# Patient Record
Sex: Male | Born: 1985 | Race: White | Hispanic: No | Marital: Married | Smoking: Never smoker
Health system: Southern US, Community
[De-identification: ages and names within clinical notes are randomized; demographics above are authoritative.]

## PROBLEM LIST (undated history)

## (undated) DIAGNOSIS — Z87442 Personal history of urinary calculi: Secondary | ICD-10-CM

## (undated) DIAGNOSIS — T783XXA Angioneurotic edema, initial encounter: Secondary | ICD-10-CM

## (undated) DIAGNOSIS — M5416 Radiculopathy, lumbar region: Secondary | ICD-10-CM

## (undated) DIAGNOSIS — Z8781 Personal history of (healed) traumatic fracture: Secondary | ICD-10-CM

## (undated) DIAGNOSIS — B0222 Postherpetic trigeminal neuralgia: Secondary | ICD-10-CM

## (undated) DIAGNOSIS — M1990 Unspecified osteoarthritis, unspecified site: Secondary | ICD-10-CM

## (undated) DIAGNOSIS — G43909 Migraine, unspecified, not intractable, without status migrainosus: Secondary | ICD-10-CM

## (undated) DIAGNOSIS — K219 Gastro-esophageal reflux disease without esophagitis: Secondary | ICD-10-CM

## (undated) DIAGNOSIS — N200 Calculus of kidney: Secondary | ICD-10-CM

## (undated) HISTORY — PX: OTHER SURGICAL HISTORY: SHX169

## (undated) HISTORY — DX: Postherpetic trigeminal neuralgia: B02.22

## (undated) HISTORY — DX: Unspecified osteoarthritis, unspecified site: M19.90

## (undated) HISTORY — DX: Angioneurotic edema, initial encounter: T78.3XXA

## (undated) HISTORY — DX: Personal history of (healed) traumatic fracture: Z87.81

## (undated) HISTORY — DX: Radiculopathy, lumbar region: M54.16

## (undated) HISTORY — PX: ANTERIOR CRUCIATE LIGAMENT REPAIR: SHX115

## (undated) HISTORY — DX: Gastro-esophageal reflux disease without esophagitis: K21.9

## (undated) HISTORY — DX: Migraine, unspecified, not intractable, without status migrainosus: G43.909

---
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
## 2013-10-03 ENCOUNTER — Emergency Department (HOSPITAL_BASED_OUTPATIENT_CLINIC_OR_DEPARTMENT_OTHER)
Admission: EM | Admit: 2013-10-03 | Discharge: 2013-10-03 | Disposition: A | Discharge status: Home / Self Care | Payer: 59 | Attending: Emergency Medicine | Admitting: Emergency Medicine

## 2013-10-03 ENCOUNTER — Encounter (HOSPITAL_BASED_OUTPATIENT_CLINIC_OR_DEPARTMENT_OTHER): Payer: Self-pay | Admitting: Emergency Medicine

## 2013-10-03 ENCOUNTER — Emergency Department (HOSPITAL_BASED_OUTPATIENT_CLINIC_OR_DEPARTMENT_OTHER): Payer: 59

## 2013-10-03 DIAGNOSIS — R61 Generalized hyperhidrosis: Secondary | ICD-10-CM | POA: Insufficient documentation

## 2013-10-03 DIAGNOSIS — N200 Calculus of kidney: Secondary | ICD-10-CM

## 2013-10-03 DIAGNOSIS — N1330 Unspecified hydronephrosis: Secondary | ICD-10-CM | POA: Insufficient documentation

## 2013-10-03 DIAGNOSIS — Z8639 Personal history of other endocrine, nutritional and metabolic disease: Secondary | ICD-10-CM | POA: Insufficient documentation

## 2013-10-03 DIAGNOSIS — Z862 Personal history of diseases of the blood and blood-forming organs and certain disorders involving the immune mechanism: Secondary | ICD-10-CM | POA: Insufficient documentation

## 2013-10-03 DIAGNOSIS — N201 Calculus of ureter: Secondary | ICD-10-CM | POA: Insufficient documentation

## 2013-10-03 HISTORY — DX: Gilbert syndrome: E80.4

## 2013-10-03 LAB — URINALYSIS, ROUTINE W REFLEX MICROSCOPIC
GLUCOSE, UA: NEGATIVE mg/dL
KETONES UR: 15 mg/dL — AB
Nitrite: NEGATIVE
Protein, ur: 100 mg/dL — AB
SPECIFIC GRAVITY, URINE: 1.03 (ref 1.005–1.030)
Urobilinogen, UA: 0.2 mg/dL (ref 0.0–1.0)
pH: 5.5 (ref 5.0–8.0)

## 2013-10-03 LAB — URINE MICROSCOPIC-ADD ON

## 2013-10-03 MED ORDER — MORPHINE SULFATE 4 MG/ML IJ SOLN
4.0000 mg | Freq: Once | INTRAMUSCULAR | Status: AC
  Administered 2013-10-03: 4 mg via INTRAVENOUS
  Filled 2013-10-03: qty 1

## 2013-10-03 MED ORDER — OXYCODONE-ACETAMINOPHEN 5-325 MG PO TABS: 2.0000 | ORAL_TABLET | ORAL | Status: DC | PRN

## 2013-10-03 MED ORDER — KETOROLAC TROMETHAMINE 30 MG/ML IJ SOLN
30.0000 mg | Freq: Once | INTRAMUSCULAR | Status: AC
  Administered 2013-10-03: 30 mg via INTRAVENOUS
  Filled 2013-10-03: qty 1

## 2013-10-03 MED ORDER — ONDANSETRON HCL 4 MG/2ML IJ SOLN
4.0000 mg | Freq: Once | INTRAMUSCULAR | Status: AC
  Administered 2013-10-03: 4 mg via INTRAVENOUS
  Filled 2013-10-03: qty 2

## 2013-10-03 NOTE — ED Provider Notes (Signed)
CSN: [id number]     Arrival date & time 10/03/13  3244 History   This chart was scribed for Geoffery Lyons, MD by Ladona Ridgel Day, ED scribe. This patient was seen in room MH09/MH09 and the patient's care was started at 1917.  Chief Complaint  Patient presents with  . Flank Pain   The history is provided by the patient. No language interpreter was used.   HPI Comments: Michael Werner is a 28 y.o. male who presents to the Emergency Department complaining of constant, gradually worsened right flank pain which radiates to his right groin, onset today. He reports the pain seemed to occur all at once and reports dark tea colored urine. He denies hx of kidney stones. Denies any baldder or bowel issues.   He is allergic to codeine   Past Medical History  Diagnosis Date  . Sullivan Lone syndrome    History reviewed. No pertinent past surgical history. History reviewed. No pertinent family history. History  Substance Use Topics  . Smoking status: Never Smoker   . Smokeless tobacco: Not on file  . Alcohol Use: Yes    Review of Systems  Constitutional: Negative for fever and chills.  Respiratory: Negative for cough and shortness of breath.   Cardiovascular: Negative for chest pain.  Gastrointestinal: Positive for abdominal pain. Negative for nausea and vomiting.  Genitourinary: Positive for flank pain. Negative for dysuria.  Musculoskeletal: Negative for back pain.  All other systems reviewed and are negative.   Allergies  Codeine  Home Medications  No current outpatient prescriptions on file. BP 154/111  Pulse 129  SpO2 100% Physical Exam  Nursing note and vitals reviewed. Constitutional: He is oriented to person, place, and time. He appears well-developed and well-nourished. No distress.  Pt appears uncomfortable and is writhing.   HENT:  Head: Normocephalic and atraumatic.  Eyes: Conjunctivae are normal. Right eye exhibits no discharge. Left eye exhibits no discharge.  Neck: Normal  range of motion.  Cardiovascular: Normal rate.   Pulmonary/Chest: Effort normal. No respiratory distress.  Abdominal: Soft. He exhibits no distension. There is tenderness. There is no rebound and no guarding.  Tenderness to palpation on right upper and right lower quadrants w/no rebound or guarding.   Musculoskeletal: Normal range of motion. He exhibits no edema.  Neurological: He is alert and oriented to person, place, and time.  Skin: Skin is warm. He is diaphoretic.  Psychiatric: He has a normal mood and affect. Thought content normal.   ED Course  Procedures (including critical care time) DIAGNOSTIC STUDIES: Oxygen Saturation is 100% on room air, normal by my interpretation.    COORDINATION OF CARE: At 745 PM Discussed treatment plan with patient which includes pain, nausea medicine, abdominal CT. Patient agrees.   Labs Review Labs Reviewed - No data to display Imaging Review No results found.  EKG Interpretation   None      MDM   Final diagnoses:  None    Patient presents here with complaints of severe right flank pain. His presentation and physical exam were consistent with a renal calculus. Urinalysis shows blood and CT scan reveals a 3 mm stone at the UVJ with hydronephrosis. He is feeling better with Toradol and morphine and appears appropriate for discharge. He'll be given pain medication and advised to followup with urology if he has not passed the stone in the next 3 days. He understands to return if he develops severe pain unrelieved with his medications, high fever, or other new or concerning  symptoms.   I personally performed the services described in this documentation, which was scribed in my presence. The recorded information has been reviewed and is accurate.      Geoffery Lyonsouglas Cordel Drewes, MD 10/03/13 2131

## 2013-10-03 NOTE — Discharge Instructions (Signed)
Percocet as prescribed as needed for pain.  Return to the emergency department if you develop severe pain not relieved with your medicine, high fever, or other new or concerning symptoms.  If you're still having pain on Monday, call to arrange a followup appointment with urology. The contact information has been provided on this discharge summary.   Kidney Stones Kidney stones (urolithiasis) are deposits that form inside your kidneys. The intense pain is caused by the stone moving through the urinary tract. When the stone moves, the ureter goes into spasm around the stone. The stone is usually passed in the urine.  CAUSES   A disorder that makes certain neck glands produce too much parathyroid hormone (primary hyperparathyroidism).  A buildup of uric acid crystals, similar to gout in your joints.  Narrowing (stricture) of the ureter.  A kidney obstruction present at birth (congenital obstruction).  Previous surgery on the kidney or ureters.  Numerous kidney infections. SYMPTOMS   Feeling sick to your stomach (nauseous).  Throwing up (vomiting).  Blood in the urine (hematuria).  Pain that usually spreads (radiates) to the groin.  Frequency or urgency of urination. DIAGNOSIS   Taking a history and physical exam.  Blood or urine tests.  CT scan.  Occasionally, an examination of the inside of the urinary bladder (cystoscopy) is performed. TREATMENT   Observation.  Increasing your fluid intake.  Extracorporeal shock wave lithotripsy This is a noninvasive procedure that uses shock waves to break up kidney stones.  Surgery may be needed if you have severe pain or persistent obstruction. There are various surgical procedures. Most of the procedures are performed with the use of small instruments. Only small incisions are needed to accommodate these instruments, so recovery time is minimized. The size, location, and chemical composition are all important variables that will  determine the proper choice of action for you. Talk to your health care provider to better understand your situation so that you will minimize the risk of injury to yourself and your kidney.  HOME CARE INSTRUCTIONS   Drink enough water and fluids to keep your urine clear or pale yellow. This will help you to pass the stone or stone fragments.  Strain all urine through the provided strainer. Keep all particulate matter and stones for your health care provider to see. The stone causing the pain may be as small as a grain of salt. It is very important to use the strainer each and every time you pass your urine. The collection of your stone will allow your health care provider to analyze it and verify that a stone has actually passed. The stone analysis will often identify what you can do to reduce the incidence of recurrences.  Only take over-the-counter or prescription medicines for pain, discomfort, or fever as directed by your health care provider.  Make a follow-up appointment with your health care provider as directed.  Get follow-up X-rays if required. The absence of pain does not always mean that the stone has passed. It may have only stopped moving. If the urine remains completely obstructed, it can cause loss of kidney function or even complete destruction of the kidney. It is your responsibility to make sure X-rays and follow-ups are completed. Ultrasounds of the kidney can show blockages and the status of the kidney. Ultrasounds are not associated with any radiation and can be performed easily in a matter of minutes. SEEK MEDICAL CARE IF:  You experience pain that is progressive and unresponsive to any pain medicine you  have been prescribed. SEEK IMMEDIATE MEDICAL CARE IF:   Pain cannot be controlled with the prescribed medicine.  You have a fever or shaking chills.  The severity or intensity of pain increases over 18 hours and is not relieved by pain medicine.  You develop a new onset  of abdominal pain.  You feel faint or pass out.  You are unable to urinate. MAKE SURE YOU:   Understand these instructions.  Will watch your condition.  Will get help right away if you are not doing well or get worse. Document Released: 08/01/2005 Document Revised: 04/03/2013 Document Reviewed: 01/02/2013 [facility] Patient Information 2014 [place], [place].

## 2013-10-03 NOTE — ED Notes (Signed)
Pt states right flank pain with no history of kidney stones. Pt states that his urine is dark as well. Pt states pain radiates into his groin and has gotten worse through the day. Pt denies problems with urination.

## 2013-10-05 ENCOUNTER — Emergency Department (HOSPITAL_BASED_OUTPATIENT_CLINIC_OR_DEPARTMENT_OTHER)
Admission: EM | Admit: 2013-10-05 | Discharge: 2013-10-05 | Disposition: A | Discharge status: Home / Self Care | Payer: 59 | Attending: Emergency Medicine | Admitting: Emergency Medicine

## 2013-10-05 ENCOUNTER — Encounter (HOSPITAL_BASED_OUTPATIENT_CLINIC_OR_DEPARTMENT_OTHER): Payer: Self-pay | Admitting: Emergency Medicine

## 2013-10-05 DIAGNOSIS — N200 Calculus of kidney: Secondary | ICD-10-CM | POA: Insufficient documentation

## 2013-10-05 DIAGNOSIS — Z862 Personal history of diseases of the blood and blood-forming organs and certain disorders involving the immune mechanism: Secondary | ICD-10-CM | POA: Insufficient documentation

## 2013-10-05 DIAGNOSIS — Z8639 Personal history of other endocrine, nutritional and metabolic disease: Secondary | ICD-10-CM | POA: Insufficient documentation

## 2013-10-05 HISTORY — DX: Calculus of kidney: N20.0

## 2013-10-05 MED ORDER — ONDANSETRON 4 MG PO TBDP: ORAL_TABLET | ORAL | Status: DC

## 2013-10-05 MED ORDER — OXYCODONE-ACETAMINOPHEN 5-325 MG PO TABS
2.0000 | ORAL_TABLET | Freq: Four times a day (QID) | ORAL | Status: DC | PRN
Start: 2013-10-05 — End: 2015-12-04

## 2013-10-05 MED ORDER — TAMSULOSIN HCL 0.4 MG PO CAPS: 0.4000 mg | ORAL_CAPSULE | Freq: Every day | ORAL | Status: DC

## 2013-10-05 NOTE — ED Provider Notes (Signed)
CSN: [id number]     Arrival date & time 10/05/13  0945 History   First MD Initiated Contact with Patient 10/05/13 1135     Chief Complaint  Patient presents with  . Medication Refill     (Consider location/radiation/quality/duration/timing/severity/associated sxs/prior Treatment) Patient is a 28 y.o. male presenting with flank pain. The history is provided by the patient.  Flank Pain This is a new problem. The current episode started 2 days ago. Episode frequency: intermittent. The problem has been gradually improving. Pertinent negatives include no chest pain, no abdominal pain, no headaches and no shortness of breath. Nothing aggravates the symptoms. Nothing relieves the symptoms. Treatments tried: percocet. The treatment provided significant relief.    Past Medical History  Diagnosis Date  . Gilbert syndrome   . Kidney stones    History reviewed. No pertinent past surgical history. No family history on file. History  Substance Use Topics  . Smoking status: Never Smoker   . Smokeless tobacco: Not on file  . Alcohol Use: Yes    Review of Systems  Constitutional: Negative for fever.  HENT: Negative for drooling and rhinorrhea.   Eyes: Negative for pain.  Respiratory: Negative for cough and shortness of breath.   Cardiovascular: Negative for chest pain and leg swelling.  Gastrointestinal: Negative for nausea, vomiting, abdominal pain and diarrhea.  Genitourinary: Positive for flank pain. Negative for dysuria and hematuria.  Musculoskeletal: Negative for gait problem and neck pain.  Skin: Negative for color change.  Neurological: Negative for numbness and headaches.  Hematological: Negative for adenopathy.  Psychiatric/Behavioral: Negative for behavioral problems.  All other systems reviewed and are negative.      Allergies  Codeine  Home Medications   Current Outpatient Rx  Name  Route  Sig  Dispense  Refill  . oxyCODONE-acetaminophen (PERCOCET) 5-325 MG per  tablet   Oral   Take 2 tablets by mouth every 4 (four) hours as needed.   20 tablet   0    BP 152/97  Pulse 98  Temp(Src) 98.8 F (37.1 C) (Oral)  Resp 18  SpO2 96% Physical Exam  Nursing note and vitals reviewed. Constitutional: He is oriented to person, place, and time. He appears well-developed and well-nourished.  HENT:  Head: Normocephalic and atraumatic.  Right Ear: External ear normal.  Left Ear: External ear normal.  Nose: Nose normal.  Mouth/Throat: Oropharynx is clear and moist. No oropharyngeal exudate.  Eyes: Conjunctivae and EOM are normal. Pupils are equal, round, and reactive to light.  Neck: Normal range of motion. Neck supple.  Cardiovascular: Normal rate, regular rhythm, normal heart sounds and intact distal pulses.  Exam reveals no gallop and no friction rub.   No murmur heard. Pulmonary/Chest: Effort normal and breath sounds normal. No respiratory distress. He has no wheezes.  Abdominal: Soft. Bowel sounds are normal. He exhibits no distension. There is no tenderness. There is no rebound and no guarding.  Musculoskeletal: Normal range of motion. He exhibits no edema and no tenderness.  No CVA ttp noted.   Neurological: He is alert and oriented to person, place, and time.  Skin: Skin is warm and dry.  Psychiatric: He has a normal mood and affect. His behavior is normal.    ED Course  Procedures (including critical care time) Labs Review Labs Reviewed - No data to display Imaging Review Ct Abdomen Pelvis Wo Contrast  10/03/2013   CLINICAL DATA:  Right lower quadrant pain, hematuria. Possible kidney stone.  EXAM: CT ABDOMEN AND PELVIS  WITHOUT CONTRAST  TECHNIQUE: Multidetector CT imaging of the abdomen and pelvis was performed following the standard protocol without intravenous contrast.  COMPARISON:  None.  FINDINGS: Lung bases are clear. No effusions. Heart is normal size.  Liver, gallbladder, spleen, pancreas, adrenals are unremarkable. Punctate lower pole  right renal stone noted which is nonobstructing. There is mild fullness/hydronephrosis on the right due to a 3 mm distal right ureteral stone.  Appendix is visualized and is normal. Stomach, large and small bowel grossly unremarkable. Urinary bladder is decompressed. No acute bony abnormality.  IMPRESSION: Obstructing distal right ureteral stone measuring 3 mm. Mild right hydronephrosis. Small right lower pole nonobstructing renal stone.  Normal appendix.   Electronically Signed   By: Charlett NoseKevin  Dover M.D.   On: 10/03/2013 21:26    EKG Interpretation   None       MDM   Final diagnoses:  Nephrolithiasis    12:10 PM 28 y.o. male seen here several days ago for right flank pain and found to have a kidney stone. He notes that he ran out of his Percocet at home but has continued pain. He was taking the Percocet 2 tablets every 4 hours as prescribed. He is in no pain on exam now. He denies any fevers, vomiting, or diarrhea. He has no new symptoms. He is afebrile and vital signs are unremarkable here. Will provide new prescription and recommend he take 1 tablet of Percocet every 6 hours.  12:11 PM: Will provide new Rx's.  I have discussed the diagnosis/risks/treatment options with the patient and believe the pt to be eligible for discharge home to follow-up with urology as needed. We also discussed returning to the ED immediately if new or worsening sx occur. We discussed the sx which are most concerning (e.g., fever, inability to take po, worsening pain) that necessitate immediate return. Medications administered to the patient during their visit and any new prescriptions provided to the patient are listed below.  Medications given during this visit Medications - No data to display  Discharge Medication List as of 10/05/2013 12:13 PM    START taking these medications   Details  ondansetron (ZOFRAN ODT) 4 MG disintegrating tablet 4mg  ODT q4 hours prn nausea/vomit, Print    !! oxyCODONE-acetaminophen  (PERCOCET) 5-325 MG per tablet Take 2 tablets by mouth every 6 (six) hours as needed for moderate pain., Starting 10/05/2013, Until Discontinued, Print    tamsulosin (FLOMAX) 0.4 MG CAPS capsule Take 1 capsule (0.4 mg total) by mouth daily., Starting 10/05/2013, Until Discontinued, Print     !! - Potential duplicate medications found. Please discuss with provider.         Junius ArgyleForrest S Harrel Ferrone, MD 10/05/13 2038

## 2013-10-05 NOTE — Discharge Instructions (Signed)
Kidney Stones  Kidney stones (urolithiasis) are deposits that form inside your kidneys. The intense pain is caused by the stone moving through the urinary tract. When the stone moves, the ureter goes into spasm around the stone. The stone is usually passed in the urine.   CAUSES   · A disorder that makes certain neck glands produce too much parathyroid hormone (primary hyperparathyroidism).  · A buildup of uric acid crystals, similar to gout in your joints.  · Narrowing (stricture) of the ureter.  · A kidney obstruction present at birth (congenital obstruction).  · Previous surgery on the kidney or ureters.  · Numerous kidney infections.  SYMPTOMS   · Feeling sick to your stomach (nauseous).  · Throwing up (vomiting).  · Blood in the urine (hematuria).  · Pain that usually spreads (radiates) to the groin.  · Frequency or urgency of urination.  DIAGNOSIS   · Taking a history and physical exam.  · Blood or urine tests.  · CT scan.  · Occasionally, an examination of the inside of the urinary bladder (cystoscopy) is performed.  TREATMENT   · Observation.  · Increasing your fluid intake.  · Extracorporeal shock wave lithotripsy This is a noninvasive procedure that uses shock waves to break up kidney stones.  · Surgery may be needed if you have severe pain or persistent obstruction. There are various surgical procedures. Most of the procedures are performed with the use of small instruments. Only small incisions are needed to accommodate these instruments, so recovery time is minimized.  The size, location, and chemical composition are all important variables that will determine the proper choice of action for you. Talk to your health care provider to better understand your situation so that you will minimize the risk of injury to yourself and your kidney.   HOME CARE INSTRUCTIONS   · Drink enough water and fluids to keep your urine clear or pale yellow. This will help you to pass the stone or stone fragments.  · Strain  all urine through the provided strainer. Keep all particulate matter and stones for your health care provider to see. The stone causing the pain may be as small as a grain of salt. It is very important to use the strainer each and every time you pass your urine. The collection of your stone will allow your health care provider to analyze it and verify that a stone has actually passed. The stone analysis will often identify what you can do to reduce the incidence of recurrences.  · Only take over-the-counter or prescription medicines for pain, discomfort, or fever as directed by your health care provider.  · Make a follow-up appointment with your health care provider as directed.  · Get follow-up X-rays if required. The absence of pain does not always mean that the stone has passed. It may have only stopped moving. If the urine remains completely obstructed, it can cause loss of kidney function or even complete destruction of the kidney. It is your responsibility to make sure X-rays and follow-ups are completed. Ultrasounds of the kidney can show blockages and the status of the kidney. Ultrasounds are not associated with any radiation and can be performed easily in a matter of minutes.  SEEK MEDICAL CARE IF:  · You experience pain that is progressive and unresponsive to any pain medicine you have been prescribed.  SEEK IMMEDIATE MEDICAL CARE IF:   · Pain cannot be controlled with the prescribed medicine.  · You have a fever   or shaking chills.  · The severity or intensity of pain increases over 18 hours and is not relieved by pain medicine.  · You develop a new onset of abdominal pain.  · You feel faint or pass out.  · You are unable to urinate.  MAKE SURE YOU:   · Understand these instructions.  · Will watch your condition.  · Will get help right away if you are not doing well or get worse.  Document Released: 08/01/2005 Document Revised: 04/03/2013 Document Reviewed: 01/02/2013  Exit[employer]® Patient Information ©2014  [employer].

## 2013-10-05 NOTE — ED Notes (Signed)
Patient here needing more pain meds for ongoing kidney stone, seen here on THursday for same, no pain on assessment

## 2013-10-10 ENCOUNTER — Emergency Department (HOSPITAL_BASED_OUTPATIENT_CLINIC_OR_DEPARTMENT_OTHER): Payer: 59

## 2013-10-10 ENCOUNTER — Encounter (HOSPITAL_BASED_OUTPATIENT_CLINIC_OR_DEPARTMENT_OTHER): Payer: Self-pay | Admitting: Emergency Medicine

## 2013-10-10 ENCOUNTER — Emergency Department (HOSPITAL_BASED_OUTPATIENT_CLINIC_OR_DEPARTMENT_OTHER)
Admission: EM | Admit: 2013-10-10 | Discharge: 2013-10-10 | Disposition: A | Discharge status: Home / Self Care | Payer: 59 | Attending: Emergency Medicine | Admitting: Emergency Medicine

## 2013-10-10 DIAGNOSIS — Z862 Personal history of diseases of the blood and blood-forming organs and certain disorders involving the immune mechanism: Secondary | ICD-10-CM | POA: Insufficient documentation

## 2013-10-10 DIAGNOSIS — Z79899 Other long term (current) drug therapy: Secondary | ICD-10-CM | POA: Insufficient documentation

## 2013-10-10 DIAGNOSIS — Z87442 Personal history of urinary calculi: Secondary | ICD-10-CM | POA: Insufficient documentation

## 2013-10-10 DIAGNOSIS — Z8639 Personal history of other endocrine, nutritional and metabolic disease: Secondary | ICD-10-CM | POA: Insufficient documentation

## 2013-10-10 DIAGNOSIS — N23 Unspecified renal colic: Secondary | ICD-10-CM | POA: Insufficient documentation

## 2013-10-10 LAB — URINALYSIS, ROUTINE W REFLEX MICROSCOPIC
GLUCOSE, UA: NEGATIVE mg/dL
KETONES UR: 40 mg/dL — AB
LEUKOCYTES UA: NEGATIVE
Nitrite: NEGATIVE
PROTEIN: 100 mg/dL — AB
Specific Gravity, Urine: 1.046 — ABNORMAL HIGH (ref 1.005–1.030)
UROBILINOGEN UA: 1 mg/dL (ref 0.0–1.0)
pH: 6 (ref 5.0–8.0)

## 2013-10-10 LAB — URINE MICROSCOPIC-ADD ON

## 2013-10-10 MED ORDER — SODIUM CHLORIDE 0.9 % IV SOLN
1000.0000 mL | Freq: Once | INTRAVENOUS | Status: AC
  Administered 2013-10-10: 1000 mL via INTRAVENOUS

## 2013-10-10 MED ORDER — HYDROMORPHONE HCL PF 1 MG/ML IJ SOLN
1.0000 mg | Freq: Once | INTRAMUSCULAR | Status: AC
  Administered 2013-10-10: 1 mg via INTRAVENOUS
  Filled 2013-10-10: qty 1

## 2013-10-10 MED ORDER — KETOROLAC TROMETHAMINE 30 MG/ML IJ SOLN
30.0000 mg | Freq: Once | INTRAMUSCULAR | Status: AC
  Administered 2013-10-10: 30 mg via INTRAVENOUS
  Filled 2013-10-10: qty 1

## 2013-10-10 MED ORDER — ONDANSETRON HCL 4 MG/2ML IJ SOLN
4.0000 mg | Freq: Once | INTRAMUSCULAR | Status: AC
  Administered 2013-10-10: 4 mg via INTRAVENOUS
  Filled 2013-10-10: qty 2

## 2013-10-10 MED ORDER — HYDROMORPHONE HCL 2 MG PO TABS
2.0000 mg | ORAL_TABLET | ORAL | Status: DC | PRN
Start: 2013-10-10 — End: 2018-11-05

## 2013-10-10 MED ORDER — SODIUM CHLORIDE 0.9 % IV SOLN
1000.0000 mL | INTRAVENOUS | Status: DC
  Administered 2013-10-10: 1000 mL via INTRAVENOUS

## 2013-10-10 MED ORDER — HYDROMORPHONE HCL PF 1 MG/ML IJ SOLN
0.5000 mg | Freq: Once | INTRAMUSCULAR | Status: AC
  Administered 2013-10-10: 0.5 mg via INTRAVENOUS
  Filled 2013-10-10: qty 1

## 2013-10-10 NOTE — ED Notes (Signed)
Rt flank pain  Hx kidney stone x 1 week

## 2013-10-10 NOTE — ED Notes (Signed)
C/o rt flank pain off and on x 1 week  Worse last 7 hours,  Has been seen by md for same  Dx w kidney stone

## 2013-10-10 NOTE — ED Provider Notes (Signed)
CSN: [id number]     Arrival date & time 10/10/13  0451 History   First MD Initiated Contact with Patient 10/10/13 0500     Chief Complaint: Flank pain  (Consider location/radiation/quality/duration/timing/severity/associated sxs/prior Treatment) The history is provided by the patient.   28 year old male comes in because of severe right flank pain which started at about 10 PM. Pain radiates somewhat to the right lower abdomen. He rates pain at 10/10. Nothing makes it better nothing makes it worse. There is associated nausea for which he took ondansetron with some relief. He took 2 doses of oxycodone acetaminophen with no relief of pain. He drank 4 glasses of water and has not had urine output despite that . He denies dysuria, fever, chills. He had been seen here one week ago for kidney stone and began 5 days ago. He had been referred to urology and thought he was supposed to have surgery on a right sided kidney stone but when he arrived for surgery if he found that it had not been scheduled.  Past Medical History  Diagnosis Date  . Gilbert syndrome   . Kidney stones    No past surgical history on file. No family history on file. History  Substance Use Topics  . Smoking status: Never Smoker   . Smokeless tobacco: Not on file  . Alcohol Use: Yes    Review of Systems  All other systems reviewed and are negative.      Allergies  Codeine  Home Medications   Current Outpatient Rx  Name  Route  Sig  Dispense  Refill  . ondansetron (ZOFRAN ODT) 4 MG disintegrating tablet      4mg  ODT q4 hours prn nausea/vomit   10 tablet   0   . oxyCODONE-acetaminophen (PERCOCET) 5-325 MG per tablet   Oral   Take 2 tablets by mouth every 4 (four) hours as needed.   20 tablet   0   . oxyCODONE-acetaminophen (PERCOCET) 5-325 MG per tablet   Oral   Take 2 tablets by mouth every 6 (six) hours as needed for moderate pain.   20 tablet   0   . tamsulosin (FLOMAX) 0.4 MG CAPS capsule   Oral  Take 1 capsule (0.4 mg total) by mouth daily.   20 capsule   0    BP 92/59  Pulse 94  Temp(Src) 98.6 F (37 C) (Oral)  Resp 22  Ht 6' (1.829 m)  Wt 210 lb (95.255 kg)  BMI 28.47 kg/m2  SpO2 100% Physical Exam  Nursing note and vitals reviewed.  28 year old male,  in obvious pain and very restless, but in no respiratory distress. Vital signs are  significant for tachypnea with respiratory rate of 22, and somewhat low blood pressure 92/59. Oxygen saturation is 100%, which is normal. Head is normocephalic and atraumatic. PERRLA, EOMI. Oropharynx is clear. Neck is nontender and supple without adenopathy or JVD. Back is nontender in the midline. There is moderate right  CVA tenderness. Lungs are clear without rales, wheezes, or rhonchi. Chest is nontender. Heart has regular rate and rhythm without murmur. Abdomen is soft, flat, nontender without masses or hepatosplenomegaly and peristalsis is hypoactive. Extremities have no cyanosis or edema, full range of motion is present. Skin is warm and mildly diaphoretic  without rash. Neurologic: Mental status is normal, cranial nerves are intact, there are no motor or sensory deficits.  ED Course  Procedures (including critical care time) Labs Review Results for orders placed during the hospital  encounter of 10/10/13  URINALYSIS, ROUTINE W REFLEX MICROSCOPIC      Result Value Ref Range   Color, Urine AMBER (*) YELLOW   APPearance CLOUDY (*) CLEAR   Specific Gravity, Urine 1.046 (*) 1.005 - 1.030   pH 6.0  5.0 - 8.0   Glucose, UA NEGATIVE  NEGATIVE mg/dL   Hgb urine dipstick LARGE (*) NEGATIVE   Bilirubin Urine MODERATE (*) NEGATIVE   Ketones, ur 40 (*) NEGATIVE mg/dL   Protein, ur 161 (*) NEGATIVE mg/dL   Urobilinogen, UA 1.0  0.0 - 1.0 mg/dL   Nitrite NEGATIVE  NEGATIVE   Leukocytes, UA NEGATIVE  NEGATIVE  URINE MICROSCOPIC-ADD ON      Result Value Ref Range   Squamous Epithelial / LPF RARE  RARE   WBC, UA 0-2  <3 WBC/hpf   RBC  / HPF 21-50  <3 RBC/hpf   Bacteria, UA RARE  RARE   Sperm, UA PRESENT     Urine-Other MUCOUS PRESENT     Imaging Review Dg Abd 1 View  10/10/2013   CLINICAL DATA:  Right flank pain and hematuria.  EXAM: ABDOMEN - 1 VIEW  COMPARISON:  CT of the abdomen and pelvis performed 10/03/2013  FINDINGS: The visualized bowel gas pattern is unremarkable. Scattered air and stool filled loops of colon are seen; no abnormal dilatation of small bowel loops is seen to suggest small bowel obstruction. No free intra-abdominal air is identified, though evaluation for free air is limited on a single supine view.  The previously noted right ureteral stone is not well characterized on the current study; a small phlebolith is noted at the right hemipelvis.  The visualized osseous structures are within normal limits; the sacroiliac joints are unremarkable in appearance. The visualized lung bases are essentially clear.  IMPRESSION: Previously noted right ureteral stone is not well characterized on the current study. Unremarkable bowel gas pattern; no free intra-abdominal air seen.   Electronically Signed   By: Roanna Raider M.D.   On: 10/10/2013 05:34   Images viewed by me.  MDM   Final diagnoses:  Ureteral colic    Ureteral colic. Old records are reviewed and I reviewed his CT scan from his ED visit in one week ago. He had a 3 mm obstructing right distal ureteral calculus twitches visible on the topogram, and so a KUB will be obtained to assess whether it has moved. He is given IV fluids, IV hydromorphone, IV ondansetron, and IV ketorolac.  He got excellent relief from the above noted treatment. Although radiologist was not able to identify the ureteral stone, I believe I can identify it and it has not moved. He is observed in the ED.  Following IV fluid bolus, he is able to urinate. He is discharged with a prescription for hydromorphone. He is scheduled for cystoscopy and possible stone extraction in five days - he is  to keep that appointment.  Dione Booze, MD 10/10/13 [phone]

## 2013-10-10 NOTE — Discharge Instructions (Signed)
Continue taking Flomax. Return to the ED if pain is not being adequately controlled, or if you start running a fever.  Kidney Stones Kidney stones (urolithiasis) are deposits that form inside your kidneys. The intense pain is caused by the stone moving through the urinary tract. When the stone moves, the ureter goes into spasm around the stone. The stone is usually passed in the urine.  CAUSES   A disorder that makes certain neck glands produce too much parathyroid hormone (primary hyperparathyroidism).  A buildup of uric acid crystals, similar to gout in your joints.  Narrowing (stricture) of the ureter.  A kidney obstruction present at birth (congenital obstruction).  Previous surgery on the kidney or ureters.  Numerous kidney infections. SYMPTOMS   Feeling sick to your stomach (nauseous).  Throwing up (vomiting).  Blood in the urine (hematuria).  Pain that usually spreads (radiates) to the groin.  Frequency or urgency of urination. DIAGNOSIS   Taking a history and physical exam.  Blood or urine tests.  CT scan.  Occasionally, an examination of the inside of the urinary bladder (cystoscopy) is performed. TREATMENT   Observation.  Increasing your fluid intake.  Extracorporeal shock wave lithotripsy This is a noninvasive procedure that uses shock waves to break up kidney stones.  Surgery may be needed if you have severe pain or persistent obstruction. There are various surgical procedures. Most of the procedures are performed with the use of small instruments. Only small incisions are needed to accommodate these instruments, so recovery time is minimized. The size, location, and chemical composition are all important variables that will determine the proper choice of action for you. Talk to your health care provider to better understand your situation so that you will minimize the risk of injury to yourself and your kidney.  HOME CARE INSTRUCTIONS   Drink enough water  and fluids to keep your urine clear or pale yellow. This will help you to pass the stone or stone fragments.  Strain all urine through the provided strainer. Keep all particulate matter and stones for your health care provider to see. The stone causing the pain may be as small as a grain of salt. It is very important to use the strainer each and every time you pass your urine. The collection of your stone will allow your health care provider to analyze it and verify that a stone has actually passed. The stone analysis will often identify what you can do to reduce the incidence of recurrences.  Only take over-the-counter or prescription medicines for pain, discomfort, or fever as directed by your health care provider.  Make a follow-up appointment with your health care provider as directed.  Get follow-up X-rays if required. The absence of pain does not always mean that the stone has passed. It may have only stopped moving. If the urine remains completely obstructed, it can cause loss of kidney function or even complete destruction of the kidney. It is your responsibility to make sure X-rays and follow-ups are completed. Ultrasounds of the kidney can show blockages and the status of the kidney. Ultrasounds are not associated with any radiation and can be performed easily in a matter of minutes. SEEK MEDICAL CARE IF:  You experience pain that is progressive and unresponsive to any pain medicine you have been prescribed. SEEK IMMEDIATE MEDICAL CARE IF:   Pain cannot be controlled with the prescribed medicine.  You have a fever or shaking chills.  The severity or intensity of pain increases over 18 hours  and is not relieved by pain medicine.  You develop a new onset of abdominal pain.  You feel faint or pass out.  You are unable to urinate. MAKE SURE YOU:   Understand these instructions.  Will watch your condition.  Will get help right away if you are not doing well or get worse. Document  Released: 08/01/2005 Document Revised: 04/03/2013 Document Reviewed: 01/02/2013 [facility] Patient Information 2014 [place], [place].  Hydromorphone tablets What is this medicine? HYDROMORPHONE (hye droe MOR fone) is a pain reliever. It is used to treat moderate to severe pain. This medicine may be used for other purposes; ask your health care provider or pharmacist if you have questions. COMMON BRAND NAME(S): Dilaudid What should I tell my health care provider before I take this medicine? They need to know if you have any of these conditions: -brain tumor -drug abuse or addiction -head injury -heart disease -frequently drink alcohol containing drinks -kidney disease or problems going to the bathroom -liver disease -lung disease, asthma, or breathing problems -mental problems -an allergic or unusual reaction to lactose, hydromorphone, other opioid analgesics, other medicines, sulfites, foods, dyes, or preservatives -pregnant or trying to get pregnant -breast-feeding How should I use this medicine? Take this medicine by mouth with a glass of water. If the medicine upsets your stomach, take it with food or milk. Follow the directions on the prescription label. Do not take more medicine than you are told to take. Talk to your pediatrician regarding the use of this medicine in children. Special care may be needed. Overdosage: If you think you have taken too much of this medicine contact a poison control center or emergency room at once. NOTE: This medicine is only for you. Do not share this medicine with others. What if I miss a dose? If you miss a dose, take it as soon as you can. If it is almost time for your next dose, take only that dose. Do not take double or extra doses. What may interact with this medicine? -alcohol -antihistamines for allergy, cough and cold -medicines for anesthesia -medicines for depression, anxiety, or psychotic disturbances -medicines for sleep -muscle  relaxants -naltrexone -narcotic medicines (opiates) for pain -phenothiazines like chlorpromazine, mesoridazine, prochlorperazine, thioridazine -tramadol This list may not describe all possible interactions. Give your health care provider a list of all the medicines, herbs, non-prescription drugs, or dietary supplements you use. Also tell them if you smoke, drink alcohol, or use illegal drugs. Some items may interact with your medicine. What should I watch for while using this medicine? Tell your doctor or health care professional if your pain does not go away, if it gets worse, or if you have new or a different type of pain. You may develop tolerance to the medicine. Tolerance means that you will need a higher dose of the medicine for pain relief. Tolerance is normal and is expected if you take this medicine for a long time. Do not suddenly stop taking your medicine because you may develop a severe reaction. Your body becomes used to the medicine. This does NOT mean you are addicted. Addiction is a behavior related to getting and using a drug for a non-medical reason. If you have pain, you have a medical reason to take pain medicine. Your doctor will tell you how much medicine to take. If your doctor wants you to stop the medicine, the dose will be slowly lowered over time to avoid any side effects. You may get drowsy or dizzy. Do not drive,  use machinery, or do anything that needs mental alertness until you know how this medicine affects you. Do not stand or sit up quickly, especially if you are an older patient. This reduces the risk of dizzy or fainting spells. Alcohol may interfere with the effect of this medicine. Avoid alcoholic drinks. There are different types of narcotic medicines (opiates) for pain. If you take more than one type at the same time, you may have more side effects. Give your health care provider a list of all medicines you use. Your doctor will tell you how much medicine to take. Do  not take more medicine than directed. Call emergency for help if you have problems breathing. This medicine will cause constipation. Try to have a bowel movement at least every 2 to 3 days. If you do not have a bowel movement for 3 days, call your doctor or health care professional. Your mouth may get dry. Chewing sugarless gum or sucking hard candy, and drinking plenty of water may help. Contact your doctor if the problem does not go away or is severe. What side effects may I notice from receiving this medicine? Side effects that you should report to your doctor or health care professional as soon as possible: -allergic reactions like skin rash, itching or hives, swelling of the face, lips, or tongue -breathing problems -changes in vision -confusion -feeling faint or lightheaded, falls -seizures -slow or fast heartbeat -trouble passing urine or change in the amount of urine -trouble with balance, talking, walking -unusually weak or tired  Side effects that usually do not require medical attention (report to your doctor or health care professional if they continue or are bothersome): -difficulty sleeping -drowsiness -dry mouth -flushing -headache -itching -loss of appetite -nausea, vomiting This list may not describe all possible side effects. Call your doctor for medical advice about side effects. You may report side effects to FDA at 1-800-FDA-1088. Where should I keep my medicine? Keep out of the reach of children. This medicine can be abused. Keep your medicine in a safe place to protect it from theft. Do not share this medicine with anyone. Selling or giving away this medicine is dangerous and against the law. Store at room temperature between 15 and 30 degrees C (59 and 86 degrees F). Keep container tightly closed. Protect from light. This medicine may cause accidental overdose and death if it is taken by other adults, children, or pets. Flush any unused medicine down the toilet to  reduce the chance of harm. Do not use the medicine after the expiration date. NOTE: This sheet is a summary. It may not cover all possible information. If you have questions about this medicine, talk to your doctor, pharmacist, or health care provider.  2014, [employer]. (2013-03-05 09:50:15)

## 2015-12-04 ENCOUNTER — Emergency Department
Admission: EM | Admit: 2015-12-04 | Discharge: 2015-12-04 | Disposition: A | Discharge status: Home / Self Care | Payer: 59 | Source: Home / Self Care

## 2015-12-04 DIAGNOSIS — N23 Unspecified renal colic: Secondary | ICD-10-CM

## 2015-12-04 LAB — POCT URINALYSIS DIPSTICK
Bilirubin, UA: NEGATIVE
Glucose, UA: NEGATIVE
KETONES UA: NEGATIVE
Leukocytes, UA: NEGATIVE
Nitrite, UA: NEGATIVE
PH UA: 6 (ref 5–8)
Protein, UA: 30
Spec Grav, UA: >=1.03 (ref 1.005–1.03)
Urobilinogen, UA: 1 (ref 0–1)

## 2015-12-04 MED ORDER — TAMSULOSIN HCL 0.4 MG PO CAPS: 0.4000 mg | ORAL_CAPSULE | Freq: Every day | ORAL | Status: DC

## 2015-12-04 MED ORDER — KETOROLAC TROMETHAMINE 60 MG/2ML IM SOLN
60.0000 mg | Freq: Once | INTRAMUSCULAR | Status: AC
  Administered 2015-12-04: 60 mg via INTRAMUSCULAR

## 2015-12-04 MED ORDER — OXYCODONE-ACETAMINOPHEN 5-325 MG PO TABS: 1.0000 | ORAL_TABLET | Freq: Four times a day (QID) | ORAL | Status: DC | PRN

## 2015-12-04 NOTE — ED Notes (Signed)
Pt has a hx of kidney stones 3 years ago.  Today had blood in urine, lower back pain.  Pt stated that right now pain is a 6-7, on way here 8-9.

## 2015-12-04 NOTE — Discharge Instructions (Signed)
Increase fluid intake.  Strain urine.  May take Zofran as needed for nausea. If symptoms become significantly worse during the night or over the weekend, proceed to the local emergency room.    Kidney Stones Kidney stones (urolithiasis) are deposits that form inside your kidneys. The intense pain is caused by the stone moving through the urinary tract. When the stone moves, the ureter goes into spasm around the stone. The stone is usually passed in the urine.  CAUSES   A disorder that makes certain neck glands produce too much parathyroid hormone (primary hyperparathyroidism).  A buildup of uric acid crystals, similar to gout in your joints.  Narrowing (stricture) of the ureter.  A kidney obstruction present at birth (congenital obstruction).  Previous surgery on the kidney or ureters.  Numerous kidney infections. SYMPTOMS   Feeling sick to your stomach (nauseous).  Throwing up (vomiting).  Blood in the urine (hematuria).  Pain that usually spreads (radiates) to the groin.  Frequency or urgency of urination. DIAGNOSIS   Taking a history and physical exam.  Blood or urine tests.  CT scan.  Occasionally, an examination of the inside of the urinary bladder (cystoscopy) is performed. TREATMENT   Observation.  Increasing your fluid intake.  Extracorporeal shock wave lithotripsy--This is a noninvasive procedure that uses shock waves to break up kidney stones.  Surgery may be needed if you have severe pain or persistent obstruction. There are various surgical procedures. Most of the procedures are performed with the use of small instruments. Only small incisions are needed to accommodate these instruments, so recovery time is minimized. The size, location, and chemical composition are all important variables that will determine the proper choice of action for you. Talk to your health care provider to better understand your situation so that you will minimize the risk of  injury to yourself and your kidney.  HOME CARE INSTRUCTIONS   Drink enough water and fluids to keep your urine clear or pale yellow. This will help you to pass the stone or stone fragments.  Strain all urine through the provided strainer. Keep all particulate matter and stones for your health care provider to see. The stone causing the pain may be as small as a grain of salt. It is very important to use the strainer each and every time you pass your urine. The collection of your stone will allow your health care provider to analyze it and verify that a stone has actually passed. The stone analysis will often identify what you can do to reduce the incidence of recurrences.  Only take over-the-counter or prescription medicines for pain, discomfort, or fever as directed by your health care provider.  Keep all follow-up visits as told by your health care provider. This is important.  Get follow-up X-rays if required. The absence of pain does not always mean that the stone has passed. It may have only stopped moving. If the urine remains completely obstructed, it can cause loss of kidney function or even complete destruction of the kidney. It is your responsibility to make sure X-rays and follow-ups are completed. Ultrasounds of the kidney can show blockages and the status of the kidney. Ultrasounds are not associated with any radiation and can be performed easily in a matter of minutes.  Make changes to your daily diet as told by your health care provider. You may be told to:  Limit the amount of salt that you eat.  Eat 5 or more servings of fruits and vegetables each day.  Limit the amount of meat, poultry, fish, and eggs that you eat.  Collect a 24-hour urine sample as told by your health care provider.You may need to collect another urine sample every 6-12 months. SEEK MEDICAL CARE IF:  You experience pain that is progressive and unresponsive to any pain medicine you have been  prescribed. SEEK IMMEDIATE MEDICAL CARE IF:   Pain cannot be controlled with the prescribed medicine.  You have a fever or shaking chills.  The severity or intensity of pain increases over 18 hours and is not relieved by pain medicine.  You develop a new onset of abdominal pain.  You feel faint or pass out.  You are unable to urinate.   This information is not intended to replace advice given to you by your health care provider. Make sure you discuss any questions you have with your health care provider.   Document Released: 08/01/2005 Document Revised: 04/22/2015 Document Reviewed: 01/02/2013 [employer]vier Interactive Patient Education [employer].

## 2015-12-04 NOTE — ED Provider Notes (Signed)
CSN: [id number]     Arrival date & time 12/04/15  1738 History   None    Chief Complaint  Patient presents with  . Back Pain    lower left      HPI Comments: Patient noticed hematuria this morning.  About one hour ago he developed non-radiating left flank pain.  No fevers, chills, and sweats.  No nausea/vomiting. He has a past history of nephrolithiasis, having passed a right kidney stone in February, 2015.   Review of CT scan don 10/03/13 revealed an obstructing 3mm stone on the right, and a small non-obstructing right lower pole kidney stone.  He states that his stone was analyzed and determined to be a calcium stone.  Patient is a 30 y.o. male presenting with flank pain. The history is provided by the patient and the spouse.  Flank Pain This is a recurrent problem. The current episode started 1 to 2 hours ago. The problem occurs constantly. The problem has not changed since onset.Pertinent negatives include no abdominal pain. Nothing aggravates the symptoms. Nothing relieves the symptoms. He has tried nothing for the symptoms.    Past Medical History  Diagnosis Date  . Gilbert syndrome   . Kidney stones    Past Surgical History  Procedure Laterality Date  . Anterior cruciate ligament repair      Right   History reviewed. No pertinent family history. Social History  Substance Use Topics  . Smoking status: Never Smoker   . Smokeless tobacco: None  . Alcohol Use: Yes    Review of Systems  Constitutional: Negative for fever, chills, diaphoresis and fatigue.  Respiratory: Negative.   Cardiovascular: Negative.   Gastrointestinal: Negative for abdominal pain.  Genitourinary: Positive for dysuria, urgency, frequency, hematuria, flank pain and decreased urine volume. Negative for testicular pain.  Musculoskeletal: Negative.     Allergies  Codeine  Home Medications   Prior to Admission medications   Medication Sig Start Date End Date Taking? Authorizing Provider   HYDROmorphone (DILAUDID) 2 MG tablet Take 1 tablet (2 mg total) by mouth every 4 (four) hours as needed for severe pain. 10/10/13   Dione Boozeavid Glick, MD  ondansetron (ZOFRAN ODT) 4 MG disintegrating tablet 4mg  ODT q4 hours prn nausea/vomit 10/05/13   Purvis SheffieldForrest Harrison, MD  oxyCODONE-acetaminophen (PERCOCET) 5-325 MG tablet Take 1-2 tablets by mouth every 6 (six) hours as needed for moderate pain. 12/04/15   Lattie HawStephen A Lizbet Cirrincione, MD  tamsulosin (FLOMAX) 0.4 MG CAPS capsule Take 1 capsule (0.4 mg total) by mouth daily. 12/04/15   Lattie HawStephen A Kelle Ruppert, MD   Meds Ordered and Administered this Visit   Medications  ketorolac (TORADOL) injection 60 mg (not administered)    BP 141/94 mmHg  Pulse 81  Temp(Src) 98.2 F (36.8 C) (Oral)  Ht 6' (1.829 m)  Wt 222 lb 12 oz (101.039 kg)  BMI 30.20 kg/m2  SpO2 96% No data found.   Physical Exam Nursing notes and Vital Signs reviewed. Appearance:  Patient appears stated age, and in no acute distress Eyes:  Pupils are equal, round, and reactive to light and accomodation.  Extraocular movement is intact.  Conjunctivae are not inflamed  Nose:  Normal Pharynx:  Normal; moist mucous membranes  Neck:  Supple.  No adenopathy Lungs:  Clear to auscultation.  Breath sounds are equal.  Moving air well. Heart:  Regular rate and rhythm without murmurs, rubs, or gallops.  Abdomen:  Nontender without masses or hepatosplenomegaly.  Bowel sounds are present.  No CVA or  flank tenderness.  Extremities:  No edema.  Skin:  No rash present.   ED Course  Procedures none    Labs Reviewed  POCT URINALYSIS DIPSTICK:  SG >= 1.030;  BLO large; PRO /dL; otherwise negative    Imaging Review Review of CT scan abdomen/pelvis done 10/03/13 revealed no stones on the left.     MDM   1. Ureteral colic    Toradol  IM.  Begin Flomax.  Rx for Percocet 5-325 for pain (#15, no refill) Increase fluid intake.  Strain urine.  May take Zofran as needed for nausea. If symptoms become  significantly worse during the night or over the weekend, proceed to the local emergency room.  Followup with urologist if not improved in 3 days.    Lattie Haw, MD 12/04/15 [phone]

## 2015-12-06 ENCOUNTER — Telehealth: Payer: Self-pay | Admitting: Emergency Medicine

## 2015-12-06 NOTE — ED Notes (Signed)
Inquired about patient's status; encourage them to call with questions/concerns.  

## 2018-06-10 DIAGNOSIS — Z23 Encounter for immunization: Secondary | ICD-10-CM | POA: Diagnosis not present

## 2018-11-05 ENCOUNTER — Encounter: Payer: Self-pay | Admitting: *Deleted

## 2018-11-05 ENCOUNTER — Emergency Department
Admission: EM | Admit: 2018-11-05 | Discharge: 2018-11-05 | Disposition: A | Discharge status: Home / Self Care | Payer: 59 | Source: Home / Self Care

## 2018-11-05 ENCOUNTER — Other Ambulatory Visit: Payer: Self-pay

## 2018-11-05 DIAGNOSIS — N23 Unspecified renal colic: Secondary | ICD-10-CM

## 2018-11-05 LAB — POCT URINALYSIS DIP (MANUAL ENTRY)
Bilirubin, UA: NEGATIVE
Glucose, UA: NEGATIVE mg/dL
Ketones, POC UA: NEGATIVE mg/dL
Leukocytes, UA: NEGATIVE
Nitrite, UA: NEGATIVE
Protein Ur, POC: 100 mg/dL — AB
Spec Grav, UA: >=1.03 — AB (ref 1.010–1.025)
Urobilinogen, UA: 1 E.U./dL
pH, UA: 6 (ref 5.0–8.0)

## 2018-11-05 MED ORDER — OXYCODONE-ACETAMINOPHEN 5-325 MG PO TABS: 2.0000 | ORAL_TABLET | ORAL | 0 refills | Status: DC | PRN

## 2018-11-05 MED ORDER — TAMSULOSIN HCL 0.4 MG PO CAPS: 0.4000 mg | ORAL_CAPSULE | Freq: Every day | ORAL | 1 refills | Status: DC

## 2018-11-05 MED ORDER — KETOROLAC TROMETHAMINE 60 MG/2ML IM SOLN
60.0000 mg | Freq: Once | INTRAMUSCULAR | Status: AC
  Administered 2018-11-05: 60 mg via INTRAMUSCULAR

## 2018-11-05 NOTE — ED Provider Notes (Addendum)
[facility]    CSN: [id number] Arrival date & time: 11/05/18  1452     History   Chief Complaint Chief Complaint  Patient presents with  . Flank Pain    HPI Michael Werner is a 33 y.o. male.   This is a 33 year old established [place] urgent care patient who presents with flank pain.  He has a history of ureteral colic as recently as 2017.  His original episode of ureteral colic was in 2015.  At that time he had a 3 mm stone in the right ureter.  Patient developed worsening pain over lunch today.  It is subsided since the intense pain earlier today.  He still is having some spasms of pain in the right flank.  He has no tenderness in that right flank.  He has had no dysuria or fever.  He has noted the urine being red.   Note from 12/04/2015: HPI Comments: Patient noticed hematuria this morning.  About one hour ago he developed non-radiating left flank pain.  No fevers, chills, and sweats.  No nausea/vomiting. He has a past history of nephrolithiasis, having passed a right kidney stone in February, 2015.   Review of CT scan don 10/03/13 revealed an obstructing 48mm stone on the right, and a small non-obstructing right lower pole kidney stone.  He states that his stone was analyzed and determined to be a calcium stone.  Patient is a 33 y.o. male presenting with flank pain. The history is provided by the patient and the spouse.  Flank Pain This is a recurrent problem. The current episode started 1 to 2 hours ago. The problem occurs constantly. The problem has not changed since onset.Pertinent negatives include no abdominal pain. Nothing aggravates the symptoms. Nothing relieves the symptoms. He has tried nothing for the symptoms.      Past Medical History:  Diagnosis Date  . Gilbert syndrome   . Kidney stones     Patient Active Problem List   Diagnosis Date Noted  . Nephrolithiasis 10/05/2013    Past Surgical History:  Procedure Laterality Date  . ANTERIOR  CRUCIATE LIGAMENT REPAIR     Right       Home Medications    Prior to Admission medications   Medication Sig Start Date End Date Taking? Authorizing Provider  oxyCODONE-acetaminophen (PERCOCET/ROXICET) 5-325 MG tablet Take 2 tablets by mouth every 4 (four) hours as needed for severe pain. 11/05/18   Elvina Sidle, MD  tamsulosin (FLOMAX) 0.4 MG CAPS capsule Take 1 capsule (0.4 mg total) by mouth daily. 11/05/18   Elvina Sidle, MD    Family History History reviewed. No pertinent family history.  Social History Social History   Tobacco Use  . Smoking status: Never Smoker  . Smokeless tobacco: Never Used  Substance Use Topics  . Alcohol use: Yes  . Drug use: Yes    Types: Marijuana     Allergies   Codeine   Review of Systems Review of Systems  Constitutional: Negative for fever.  Gastrointestinal: Negative.   Genitourinary: Positive for flank pain.  All other systems reviewed and are negative.    Physical Exam Triage Vital Signs ED Triage Vitals  Enc Vitals Group     BP      Pulse      Resp      Temp      Temp src      SpO2      Weight      Height  Head Circumference      Peak Flow      Pain Score      Pain Loc      Pain Edu?      Excl. in GC?    No data found.  Updated Vital Signs BP (!) 141/92 (BP Location: Right Arm)   Pulse 81   Temp 98.3 F (36.8 C) (Oral)   Resp 18   Ht 6' (1.829 m)   Wt 106.1 kg   SpO2 97%   BMI 31.74 kg/m    Physical Exam Vitals signs and nursing note reviewed.  Constitutional:      Appearance: Normal appearance. He is normal weight.  HENT:     Head: Normocephalic.  Eyes:     Conjunctiva/sclera: Conjunctivae normal.  Neck:     Musculoskeletal: Normal range of motion and neck supple.  Pulmonary:     Effort: Pulmonary effort is normal.  Musculoskeletal:        General: No tenderness, deformity or signs of injury.     Right lower leg: No edema.     Left lower leg: No edema.  Skin:    General:  Skin is warm and dry.  Neurological:     Mental Status: He is alert.  Psychiatric:        Mood and Affect: Mood normal.        Thought Content: Thought content normal.        Judgment: Judgment normal.      UC Treatments / Results  Labs (all labs ordered are listed, but only abnormal results are displayed) Labs Reviewed - No data to display  EKG None  Radiology CT ABDOMEN AND PELVIS WITHOUT CONTRAST  TECHNIQUE: Multidetector CT imaging of the abdomen and pelvis was performed following the standard protocol without intravenous contrast.  COMPARISON:  None.  FINDINGS: Lung bases are clear. No effusions. Heart is normal size.  Liver, gallbladder, spleen, pancreas, adrenals are unremarkable. Punctate lower pole right renal stone noted which is nonobstructing. There is mild fullness/hydronephrosis on the right due to a 3 mm distal right ureteral stone.  Appendix is visualized and is normal. Stomach, large and small bowel grossly unremarkable. Urinary bladder is decompressed. No acute bony abnormality.  IMPRESSION: Obstructing distal right ureteral stone measuring 3 mm. Mild right hydronephrosis. Small right lower pole nonobstructing renal stone.  Normal appendix.   Electronically Signed   By: Charlett Nose M.D.   On: 10/03/2013 21:26  Procedures Procedures (including critical care time)  Medications Ordered in UC Medications  ketorolac (TORADOL) injection 60 mg (has no administration in time range)    Initial Impression / Assessment and Plan / UC Course  I have reviewed the triage vital signs and the nursing notes.  Pertinent labs & imaging results that were available during my care of the patient were reviewed by me and considered in my medical decision making (see chart for details).    Final Clinical Impressions(s) / UC Diagnoses   Final diagnoses:  Ureteral colic     Discharge Instructions     Drink plenty of fluids.  If pain becomes  persistently unbearable, go to the emergency room for consideration of a CT scan. In [place] with the best facilities for getting a CT scan are the freestanding emergency room on [address] [facility] where the specialists in urology are located.    ED Prescriptions    Medication Sig Dispense Auth. Provider   tamsulosin (FLOMAX) 0.4 MG CAPS capsule  Take 1 capsule (0.4 mg total) by mouth daily. 20 capsule Elvina SidleLauenstein, Julius Matus, MD   oxyCODONE-acetaminophen (PERCOCET/ROXICET) 5-325 MG tablet Take 2 tablets by mouth every 4 (four) hours as needed for severe pain. 12 tablet Elvina SidleLauenstein, Dayle Sherpa, MD     Controlled Substance Prescriptions [place] Controlled Substance Registry consulted? Not Applicable   Elvina SidleLauenstein, Aseneth Hack, MD 11/05/18 [id number]    Elvina SidleLauenstein, Albaraa Swingle, MD 11/05/18 1538

## 2018-11-05 NOTE — Discharge Instructions (Signed)
Drink plenty of fluids.  If pain becomes persistently unbearable, go to the emergency room for consideration of a CT scan. In [place] with the best facilities for getting a CT scan are the freestanding emergency room on [address] and [facility] where the specialists in urology are located.

## 2018-11-05 NOTE — ED Triage Notes (Signed)
Pt c/o RT flank pain and RLQ abd pain x 2 days. Hx of kidney stones.

## 2018-11-09 ENCOUNTER — Telehealth: Payer: Self-pay | Admitting: Emergency Medicine

## 2018-11-09 ENCOUNTER — Other Ambulatory Visit: Payer: Self-pay

## 2018-11-09 ENCOUNTER — Emergency Department
Admission: EM | Admit: 2018-11-09 | Discharge: 2018-11-09 | Disposition: A | Discharge status: Home / Self Care | Payer: 59 | Source: Home / Self Care | Attending: Emergency Medicine | Admitting: Emergency Medicine

## 2018-11-09 ENCOUNTER — Emergency Department (INDEPENDENT_AMBULATORY_CARE_PROVIDER_SITE_OTHER): Payer: 59

## 2018-11-09 ENCOUNTER — Encounter: Payer: Self-pay | Admitting: Emergency Medicine

## 2018-11-09 DIAGNOSIS — N201 Calculus of ureter: Secondary | ICD-10-CM

## 2018-11-09 DIAGNOSIS — N202 Calculus of kidney with calculus of ureter: Secondary | ICD-10-CM | POA: Diagnosis not present

## 2018-11-09 LAB — POCT URINALYSIS DIP (MANUAL ENTRY)
Bilirubin, UA: NEGATIVE
GLUCOSE UA: NEGATIVE mg/dL
Ketones, POC UA: NEGATIVE mg/dL
Leukocytes, UA: NEGATIVE
Nitrite, UA: NEGATIVE
Protein Ur, POC: NEGATIVE mg/dL
SPEC GRAV UA: 1.02 (ref 1.010–1.025)
Urobilinogen, UA: 1 E.U./dL
pH, UA: 7 (ref 5.0–8.0)

## 2018-11-09 MED ORDER — OXYCODONE-ACETAMINOPHEN 5-325 MG PO TABS: 2.0000 | ORAL_TABLET | ORAL | 0 refills | Status: DC | PRN

## 2018-11-09 NOTE — Telephone Encounter (Signed)
Patient called today because he has not passed the kidney stone from when he was here on 11/05/18. Has one oxycodone left and is still in a lot of pain, asking if we can refill the pain med. Dr Cleta Alberts wants him to have a CT, patient acknowledges understanding and will come in in the next hour.

## 2018-11-09 NOTE — ED Provider Notes (Addendum)
[facility]    CSN: [id number] Arrival date & time: 11/09/18  1118     History   Chief Complaint Chief Complaint  Patient presents with   Nephrolithiasis    HPI REGINALD MCCALEB is a 33 y.o. male.  Patient was seen here 4 days ago with severe right flank discomfort.  He was diagnosed with a probable kidney stone and given pain medication and Flomax.  He does have a history of stones.  He has had intermittent pain over the last few days.  He has tried not to take his pain medication.  He is taking his Flomax.  He does not seem to have as much blood in his urine as previous.  He has not had any fever or chills.  He denies burning or stinging or pain with urination. HPI  Past Medical History:  Diagnosis Date   Sullivan Lone syndrome    Kidney stones     Patient Active Problem List   Diagnosis Date Noted   Nephrolithiasis 10/05/2013    Past Surgical History:  Procedure Laterality Date   ANTERIOR CRUCIATE LIGAMENT REPAIR     Right       Home Medications    Prior to Admission medications   Medication Sig Start Date End Date Taking? Authorizing Provider  oxyCODONE-acetaminophen (PERCOCET/ROXICET) 5-325 MG tablet Take 2 tablets by mouth every 4 (four) hours as needed for severe pain. 11/09/18   Collene Gobble, MD  tamsulosin (FLOMAX) 0.4 MG CAPS capsule Take 1 capsule (0.4 mg total) by mouth daily. 11/05/18   Elvina Sidle, MD    Family History History reviewed. No pertinent family history.  Social History Social History   Tobacco Use   Smoking status: Never Smoker   Smokeless tobacco: Never Used  Substance Use Topics   Alcohol use: Yes   Drug use: Yes    Types: Marijuana     Allergies   Codeine   Review of Systems Review of Systems  Constitutional: Negative.   Respiratory: Negative.   Gastrointestinal: Positive for abdominal pain and nausea.  Genitourinary: Positive for flank pain and hematuria.  Musculoskeletal: Positive for back  pain.  Skin: Negative.   Hematological: Negative.      Physical Exam Triage Vital Signs ED Triage Vitals  Enc Vitals Group     BP 11/09/18 1135 (!) 135/91     Pulse Rate 11/09/18 1135 87     Resp --      Temp 11/09/18 1135 99 F (37.2 C)     Temp Source 11/09/18 1135 Oral     SpO2 11/09/18 1135 98 %     Weight 11/09/18 1136 233 lb (105.7 kg)     Height 11/09/18 1136 6' (1.829 m)     Head Circumference --      Peak Flow --      Pain Score 11/09/18 1136 8     Pain Loc --      Pain Edu? --      Excl. in GC? --    No data found.  Updated Vital Signs BP (!) 135/91 (BP Location: Right Arm)    Pulse 87    Temp 99 F (37.2 C) (Oral)    Ht 6' (1.829 m)    Wt 105.7 kg    SpO2 98%    BMI 31.60 kg/m   Visual Acuity Right Eye Distance:   Left Eye Distance:   Bilateral Distance:    Right Eye Near:   Left Eye Near:  Bilateral Near:     Physical Exam Constitutional:      Appearance: Normal appearance.  Cardiovascular:     Rate and Rhythm: Normal rate and regular rhythm.  Pulmonary:     Effort: Pulmonary effort is normal.     Breath sounds: Normal breath sounds.  Abdominal:     General: Abdomen is flat.     Tenderness: There is no abdominal tenderness. There is right CVA tenderness.  Neurological:     Mental Status: He is alert.      UC Treatments / Results  Labs (all labs ordered are listed, but only abnormal results are displayed) Labs Reviewed  POCT URINALYSIS DIP (MANUAL ENTRY) - Abnormal; Notable for the following components:      Result Value   Blood, UA trace-intact (*)    All other components within normal limits  URINE CULTURE    EKG None  Radiology Ct Renal Stone Study  Result Date: 11/09/2018 CLINICAL DATA:  Right flank pain for 1 week EXAM: CT ABDOMEN AND PELVIS WITHOUT CONTRAST TECHNIQUE: Multidetector CT imaging of the abdomen and pelvis was performed following the standard protocol without oral or IV contrast. COMPARISON:  October 03, 2013  FINDINGS: Lower chest: There is atelectatic change in the left base region. There is no edema or consolidation. Hepatobiliary: No focal liver lesions are apparent on this noncontrast enhanced study. Gallbladder wall is not appreciably thickened. There is no biliary duct dilatation. Pancreas: There is no pancreatic mass or inflammatory focus. Spleen: No splenic lesions are evident. Adrenals/Urinary Tract: Adrenals bilaterally appear normal. There is no evident renal mass on either side. There is moderate hydronephrosis on the right. There is no appreciable hydronephrosis on the left. There is a 2 mm nonobstructing calculus in the lower pole left kidney. There is no intrarenal calculus on the right. There is a calculus in the right ureter at the level of L4 measuring 7 x 6 mm. No other ureteral calculi are evident. Urinary bladder is nearly completely empty. Urinary bladder wall thickness is felt to be within normal limits. Stomach/Bowel: There is no appreciable bowel wall or mesenteric thickening. There is no evident bowel obstruction. There is no free air or portal venous air. Vascular/Lymphatic: There is no abdominal aortic aneurysm. No vascular lesions are evident on this noncontrast enhanced study. There is no adenopathy in the abdomen or pelvis. Reproductive: Prostate and seminal vesicles are normal in size and contour. There is no evident pelvic mass. Other: Appendix appears normal. There is no abscess or ascites in the abdomen or pelvis. Musculoskeletal: There are no blastic or lytic bone lesions. There is no intramuscular or abdominal wall lesion. IMPRESSION: 1. There is a 7 x 6 mm calculus in the right ureter at the L4 level causing moderate hydronephrosis on the right. 2.  Nonobstructing 2 mm calculus lower pole left kidney. 3. No bowel obstruction. No abscess in the abdomen or pelvis. Appendix appears normal. These results will be called to the ordering clinician or representative by the Radiologist  Assistant, and communication documented in the PACS or zVision Dashboard. Electronically Signed   By: Bretta Bang III M.D.   On: 11/09/2018 12:10    Procedures Procedures (including critical care time)  Medications Ordered in UC Medications - No data to display  Initial Impression / Assessment and Plan / UC Course  I have reviewed the triage vital signs and the nursing notes.  Pertinent labs & imaging results that were available during my care of the patient  were reviewed by me and considered in my medical decision making (see chart for details).     We will proceed with CT abdomen pelvis.  Stone protocol.  Will refill pain medications.  Dr. Milus Glazier had previously checked the registry and no red flags were seen. CT scan returned confirming a 6 month for any suggestions x 7 mm obstructing right ureteral calculus at the level of L4.  I called [facility].  Dr. Ronne Binning is on call.  They will contact the patient directly regarding further instructions.  He was given a prescription for oxycodone and a strainer and follow-up will be with the urologist.  Patient has listed an allergy to codeine however this was when he was 5 or 6.  He was told it "affected his heart but he has taken his oxycodone without any difficulty. 1 PM. Patient states she has a 55-year-old at home and could not go immediately to their office.  He also stated he did not have anyone to drive.  They have given him an appointment for Monday morning.  He was instructed to go to the emergency room immediately if he had worsening pain.  I instructed the patient that if he develop fever, abdominal pain resistant to pain medication, or severe nausea to go immediately to the emergency room at [facility].  He does agree to do this.  He states there is no possible way he can go this afternoon.  Patient does understand the risks.  All questions were answered. I spoke again with Arline Asp at [facility] urology.  I then called patient  back and told him it was essential that he go directly over to the urologist office so he can get this taken care of.  He is going to arrange childcare for his 64-year-old and go immediately to their office.  I told him that not getting care has the potential of loss of his right kidney.  He did agree to go and I called back [facility] and informed them that he would be coming Final Clinical Impressions(s) / UC Diagnoses   Final diagnoses:  Right ureteral calculus     Discharge Instructions     I have contacted [facility] and they will schedule your appointment time. Please use your strainer. If you develop significant fever or pain that is not responsive to your pain medication please be seen at [facility] emergency room. Please wear a mask if you need to go to the emergency room.    ED Prescriptions    Medication Sig Dispense Auth. Provider   oxyCODONE-acetaminophen (PERCOCET/ROXICET) 5-325 MG tablet Take 2 tablets by mouth every 4 (four) hours as needed for severe pain. 12 tablet Collene Gobble, MD     Controlled Substance Prescriptions [place] Controlled Substance Registry consulted? Yes, I have consulted the [place] Controlled Substances Registry for this patient, and feel the risk/benefit ratio today is favorable for proceeding with this prescription for a controlled substance.   Collene Gobble, MD 11/09/18 1333    Collene Gobble, MD 11/09/18 [phone]

## 2018-11-09 NOTE — Discharge Instructions (Addendum)
I have contacted [facility] and they will schedule your appointment time. Please use your strainer. If you develop significant fever or pain that is not responsive to your pain medication please be seen at [facility] emergency room. Please wear a mask if you need to go to the emergency room.

## 2018-11-09 NOTE — ED Triage Notes (Signed)
RT flank pain continuing since Saturday. Needs pain meds about 4pm, pain woke him up this morning at 5am  8/10

## 2018-11-10 LAB — URINE CULTURE
MICRO NUMBER:: 358635
RESULT: NO GROWTH
SPECIMEN QUALITY:: ADEQUATE

## 2018-11-11 ENCOUNTER — Telehealth: Payer: Self-pay

## 2018-11-11 NOTE — Telephone Encounter (Signed)
Left message on VM with contact information with any questions or concerns.  Left negative lab results.

## 2018-11-26 ENCOUNTER — Other Ambulatory Visit: Payer: Self-pay | Admitting: Urology

## 2018-11-26 ENCOUNTER — Encounter (HOSPITAL_COMMUNITY): Payer: Self-pay | Admitting: General Practice

## 2018-11-28 NOTE — H&P (Signed)
Office Visit Report     11/21/2018   --------------------------------------------------------------------------------   Michael Werner  MRN: [id number]  PRIMARY CARE:    DOB: 06-25-1986, 33 year old Male  REFERRING:    SSN:   PROVIDER:  Nicolette Bang, M.D.    TREATING:  Jiles Crocker    LOCATION:  [facility] [phone]   --------------------------------------------------------------------------------   CC: I have kidney stones.  HPI: Michael Werner is a 33 year-old male established patient who is here for renal calculi.  11/09/2018: He developed right flank pain 6 days ago. he went to the ER this morning and was diagnosed with a 26m proximal ureteral calculus. Pain is currently well controlled.   11/21/2018: Returns today for f/u OV with imaging. He was continued on Tamsulosin for MET and given a prescription for pain medication at last OV. Denies interval stones material passage. His continued to have some mild to moderate right flank discomfort well controlled with Percocet he has on hand. He continues tamsulosin. Symptoms not associated with any increased urinary frequency/urgency, gross hematuria, burning/painful urination, or changes in force of stream. He denies any fevers/chills, nausea/vomiting.   The problem is on the right side. He first stated noticing pain on approximately 11/03/2018. This is not his first kidney stone. His first stone was approximately 10/13/2008. He has had 2 stones prior to getting this one. He is not currently having flank pain, back pain, groin pain, nausea, vomiting, fever or chills. He has not caught a stone in his urine strainer since his symptoms began.   He has never had surgical treatment for calculi in the past.     ALLERGIES: codiene    MEDICATIONS: Tamsulosin Hcl 0.4 mg capsule  Oxycodone Hcl     GU PSH: None     PSH Notes: ACL Repair   NON-GU PSH: Inner Ear Surgery Procedure    GU PMH: Ureteral calculus -  11/09/2018 Renal calculus    NON-GU PMH: None   FAMILY HISTORY: 1 Daughter - Other   SOCIAL HISTORY: Marital Status: Married Ethnicity: Not Hispanic Or Latino; Race: White Current Smoking Status: Patient has never smoked.   Tobacco Use Assessment Completed: Used Tobacco in last 30 days? Drinks 2 drinks per week.  Drinks 1 caffeinated drink per day. Patient's occupation iOncologist    REVIEW OF SYSTEMS:    GU Review Male:   Patient denies frequent urination, hard to postpone urination, burning/ pain with urination, get up at night to urinate, leakage of urine, stream starts and stops, trouble starting your stream, have to strain to urinate , erection problems, and penile pain.  Gastrointestinal (Upper):   Patient denies nausea, vomiting, and indigestion/ heartburn.  Gastrointestinal (Lower):   Patient denies diarrhea and constipation.  Constitutional:   Patient denies fever, night sweats, weight loss, and fatigue.  Skin:   Patient denies skin rash/ lesion and itching.  Eyes:   Patient denies blurred vision and double vision.  Ears/ Nose/ Throat:   Patient denies sore throat and sinus problems.  Hematologic/Lymphatic:   Patient denies swollen glands and easy bruising.  Cardiovascular:   Patient denies leg swelling and chest pains.  Respiratory:   Patient denies cough and shortness of breath.  Endocrine:   Patient denies excessive thirst.  Musculoskeletal:   Patient denies back pain and joint pain.  Neurological:   Patient denies headaches and dizziness.  Psychologic:   Patient denies depression and anxiety.   VITAL SIGNS:  11/21/2018 10:24 AM  Weight 233 lb / 105.69 kg  Height 72 in / 182.88 cm  BP 125/86 mmHg  Pulse 79 /min  Temperature 97.2 F / 36.2 C  BMI 31.6 kg/m   MULTI-SYSTEM PHYSICAL EXAMINATION:    Constitutional: Well-nourished. No physical deformities. Normally developed. Good grooming.  Neck: Neck symmetrical, not swollen. Normal tracheal  position.  Respiratory: No labored breathing, no use of accessory muscles.   Cardiovascular: Normal temperature, normal extremity pulses, no swelling, no varicosities.  Skin: No paleness, no jaundice, no cyanosis. No lesion, no ulcer, no rash.  Neurologic / Psychiatric: Oriented to time, oriented to place, oriented to person. No depression, no anxiety, no agitation.  Gastrointestinal: No mass, no tenderness, no rigidity, non obese abdomen.  Musculoskeletal: Normal gait and station of head and neck.     PAST DATA REVIEWED:  Source Of History:  Patient  Records Review:   Previous Hospital Records, Previous Patient Records  Urine Test Review:   Urinalysis  X-Ray Review: KUB: Reviewed Films. Discussed With Patient.  C.T. Stone Protocol: Reviewed Films. Reviewed Report.     11/21/18  Urinalysis  Urine Appearance Clear   Urine Color Yellow   Urine Glucose Neg mg/dL  Urine Bilirubin Neg mg/dL  Urine Ketones Neg mg/dL  Urine Specific Gravity 1.020   Urine Blood Neg ery/uL  Urine pH 7.0   Urine Protein Trace mg/dL  Urine Urobilinogen 0.2 mg/dL  Urine Nitrites Neg   Urine Leukocyte Esterase Neg leu/uL   PROCEDURES:         KUB - 90300  A small nonobstructing calculi was visualized in the lower pole of the left kidney. No obvious ureteral calculi noted tracing down the anatomical expected tract of the left ureter. The right renal shadow is mildly obscured by a normal-appearing bowel gas and stool pattern. No obvious renal calculi are identified. Tracing down the anatomical expected tract of the right ureter and opacity consistent with the previously identified ureteral calculus via CT imaging is noted adjacent to the terminal endplate of right L4. Prominent right pelvic phlebolith identified in the distal right hemipelvis grossly unchanged from previous imaging studies. Bladder grossly appears free of obstruction.      Patient confirmed No Neulasta OnPro Device.            Urinalysis Dipstick Dipstick Cont'd  Color: Yellow Bilirubin: Neg mg/dL  Appearance: Clear Ketones: Neg mg/dL  Specific Gravity: 1.020 Blood: Neg ery/uL  pH: 7.0 Protein: Trace mg/dL  Glucose: Neg mg/dL Urobilinogen: 0.2 mg/dL    Nitrites: Neg    Leukocyte Esterase: Neg leu/uL    ASSESSMENT:      ICD-10 Details  1 GU:   Ureteral calculus - N20.1 Right   PLAN:            Medications Refill Meds: Percocet 5 mg-325 mg tablet 1 tablet PO Q 6 H PRN   #15  0 Refill(s)            Orders Labs Urine Culture          Schedule Return Visit/Planned Activity: 1-2 Weeks - Schedule Surgery          Document Letter(s):  Created for Patient: Clinical Summary         Notes:   Ureteral stone grossly remains unchanged via imaging studies today. Symptoms continue to be well controlled. He'll continue tamsulosin and I refilled his Percocet as well. Discussed with him reserving that medication for exacerbations of moderate to severe pain  and discomfort especially in an effort to prevent constipation and dependency on the medication. Recommended use of ibuprofen 600-800 mg every 6-8 hours for mild-to-moderate pain/discomfort. He'll continue to remain well hydrated and nourished.  The patient would like to proceed with a definitive intervention instead of continuing with medical expulsive therapy. Given stones visibility, he would be a good candidate for shockwave lithotripsy. We discussed this procedure in detail.  For shockwave lithotripsy I described the risks which include arrhythmia, kidney contusion, kidney hemorrhage, need for transfusion, long-term risk of diabetes or hypertension, back discomfort, flank ecchymosis, flank abrasion, inability to break up stone, inability to pass stone fragments, Steinstrasse, infection associated with obstructing stones, need for different surgical procedure and possible need for repeat shockwave lithotripsy.  He understands that he would have to discontinue any  NSAID containing products 48 hours prior to the procedure.  I'll drew an arrow in PACS to better identify the calculus for the next provider. A message will be sent to Dr Alyson Ingles for review of imaging and permission to have the patient scheduled for the above mentioned procedure. Pt will contact the office or f/u if he passes the calculus in the interval or symptoms become less manageable/increased in severity. A preprocedural urine culture will be sent today. He'll continue tamsulosin in the interval as well.    ** Signed by Jiles Crocker on 11/21/18 at 10:55 AM (EDT)**     The information contained in this medical record document is considered private and confidential patient information. This information can only be used for the medical diagnosis and/or medical services that are being provided by the patient's selected caregivers. This information can only be distributed outside of the patient's care if the patient agrees and signs waivers of authorization for this information to be sent to an outside source or route.

## 2018-11-29 ENCOUNTER — Ambulatory Visit (HOSPITAL_COMMUNITY): Payer: 59

## 2018-11-29 ENCOUNTER — Other Ambulatory Visit: Payer: Self-pay

## 2018-11-29 ENCOUNTER — Ambulatory Visit (HOSPITAL_COMMUNITY)
Admission: RE | Admit: 2018-11-29 | Discharge: 2018-11-29 | Disposition: A | Discharge status: Home / Self Care | Payer: 59 | Attending: Urology | Admitting: Urology

## 2018-11-29 ENCOUNTER — Encounter (HOSPITAL_COMMUNITY): Payer: Self-pay | Admitting: *Deleted

## 2018-11-29 ENCOUNTER — Encounter (HOSPITAL_COMMUNITY)
Admission: RE | Disposition: A | Discharge status: Home / Self Care | Payer: Self-pay | Source: Home / Self Care | Attending: Urology

## 2018-11-29 DIAGNOSIS — Z79899 Other long term (current) drug therapy: Secondary | ICD-10-CM | POA: Insufficient documentation

## 2018-11-29 DIAGNOSIS — Z885 Allergy status to narcotic agent status: Secondary | ICD-10-CM | POA: Insufficient documentation

## 2018-11-29 DIAGNOSIS — N201 Calculus of ureter: Secondary | ICD-10-CM

## 2018-11-29 HISTORY — PX: EXTRACORPOREAL SHOCK WAVE LITHOTRIPSY: SHX1557

## 2018-11-29 HISTORY — DX: Personal history of urinary calculi: Z87.442

## 2018-11-29 SURGERY — LITHOTRIPSY, ESWL
Anesthesia: LOCAL | Laterality: Right

## 2018-11-29 MED ORDER — OXYCODONE-ACETAMINOPHEN 5-325 MG PO TABS: 1.0000 | ORAL_TABLET | Freq: Four times a day (QID) | ORAL | 0 refills | Status: AC | PRN

## 2018-11-29 MED ORDER — DIAZEPAM 5 MG PO TABS
10.0000 mg | ORAL_TABLET | ORAL | Status: AC
  Administered 2018-11-29: 10 mg via ORAL
  Filled 2018-11-29: qty 2

## 2018-11-29 MED ORDER — CIPROFLOXACIN HCL 500 MG PO TABS
500.0000 mg | ORAL_TABLET | ORAL | Status: AC
  Administered 2018-11-29: 500 mg via ORAL
  Filled 2018-11-29: qty 1

## 2018-11-29 MED ORDER — DIPHENHYDRAMINE HCL 25 MG PO CAPS
25.0000 mg | ORAL_CAPSULE | ORAL | Status: AC
  Administered 2018-11-29: 25 mg via ORAL
  Filled 2018-11-29: qty 1

## 2018-11-29 MED ORDER — SODIUM CHLORIDE 0.9 % IV SOLN
INTRAVENOUS | Status: DC
  Administered 2018-11-29: 07:00:00 via INTRAVENOUS

## 2018-11-29 NOTE — Interval H&P Note (Signed)
History and Physical Interval Note:  11/29/2018 9:14 AM  Michael Werner  has presented today for surgery, with the diagnosis of RIGHT URETERAL STONE.  The various methods of treatment have been discussed with the patient and family. After consideration of risks, benefits and other options for treatment, the patient has consented to  Procedure(s): EXTRACORPOREAL SHOCK WAVE LITHOTRIPSY (ESWL) (Right) as a surgical intervention.  The patient's history has been reviewed, patient examined, no change in status, stable for surgery.  I have reviewed the patient's chart and labs. Pt has not passed a stone and continues to have occasional right flank pain. No fever. Stone appears to be stable on KUB today in distal section of proximal right ureter. Questions were answered to the patient's satisfaction.     Jerilee Field

## 2018-11-29 NOTE — Discharge Instructions (Signed)
Lithotripsy, Care After °This sheet gives you information about how to care for yourself after your procedure. Your health care provider may also give you more specific instructions. If you have problems or questions, contact your health care provider. °What can I expect after the procedure? °After the procedure, it is common to have: °· Some blood in your urine. This should only last for a few days. °· Soreness in your back, sides, or upper abdomen for a few days. °· Blotches or bruises on your back where the pressure wave entered the skin. °· Pain, discomfort, or nausea when pieces (fragments) of the kidney stone move through the tube that carries urine from the kidney to the bladder (ureter). Stone fragments may pass soon after the procedure, but they may continue to pass for up to 4-8 weeks. °? If you have severe pain or nausea, contact your health care provider. This may be caused by a large stone that was not broken up, and this may mean that you need more treatment. °· Some pain or discomfort during urination. °· Some pain or discomfort in the lower abdomen or (in men) at the base of the penis. °Follow these instructions at home: °Medicines °· Take over-the-counter and prescription medicines only as told by your health care provider. °· If you were prescribed an antibiotic medicine, take it as told by your health care provider. Do not stop taking the antibiotic even if you start to feel better. °· Do not drive for 24 hours if you were given a medicine to help you relax (sedative). °· Do not drive or use heavy machinery while taking prescription pain medicine. °Eating and drinking ° °  ° °· Drink enough water and fluids to keep your urine clear or pale yellow. This helps any remaining pieces of the stone to pass. It can also help prevent new stones from forming. °· Eat plenty of fresh fruits and vegetables. °· Follow instructions from your health care provider about eating and drinking restrictions. You may be  instructed: °? To reduce how much salt (sodium) you eat or drink. Check ingredients and nutrition facts on packaged foods and beverages. °? To reduce how much meat you eat. °· Eat the recommended amount of calcium for your age and gender. Ask your health care provider how much calcium you should have. °General instructions °· Get plenty of rest. °· Most people can resume normal activities 1-2 days after the procedure. Ask your health care provider what activities are safe for you. °· Your health care provider may direct you to lie in a certain position (postural drainage) and tap firmly (percuss) over your kidney area to help stone fragments pass. Follow instructions as told by your health care provider. °· If directed, strain all urine through the strainer that was provided by your health care provider. °? Keep all fragments for your health care provider to see. Any stones that are found may be sent to a medical lab for examination. The stone may be as small as a grain of salt. °· Keep all follow-up visits as told by your health care provider. This is important. °Contact a health care provider if: °· You have pain that is severe or does not get better with medicine. °· You have nausea that is severe or does not go away. °· You have blood in your urine longer than your health care provider told you to expect. °· You have more blood in your urine. °· You have pain during urination that does   not go away. °· You urinate more frequently than usual and this does not go away. °· You develop a rash or any other possible signs of an allergic reaction. °Get help right away if: °· You have severe pain in your back, sides, or upper abdomen. °· You have severe pain while urinating. °· Your urine is very dark red. °· You have blood in your stool (feces). °· You cannot pass any urine at all. °· You feel a strong urge to urinate after emptying your bladder. °· You have a fever or chills. °· You develop shortness of breath,  difficulty breathing, or chest pain. °· You have severe nausea that leads to persistent vomiting. °· You faint. °Summary °· After this procedure, it is common to have some pain, discomfort, or nausea when pieces (fragments) of the kidney stone move through the tube that carries urine from the kidney to the bladder (ureter). If this pain or nausea is severe, however, you should contact your health care provider. °· Most people can resume normal activities 1-2 days after the procedure. Ask your health care provider what activities are safe for you. °· Drink enough water and fluids to keep your urine clear or pale yellow. This helps any remaining pieces of the stone to pass, and it can help prevent new stones from forming. °· If directed, strain your urine and keep all fragments for your health care provider to see. Fragments or stones may be as small as a grain of salt. °· Get help right away if you have severe pain in your back, sides, or upper abdomen or have severe pain while urinating. °This information is not intended to replace advice given to you by your health care provider. Make sure you discuss any questions you have with your health care provider. °Document Released: 08/21/2007 Document Revised: 01/10/2018 Document Reviewed: 06/22/2016 °Elsevier Interactive Patient Education © 2019 [employer] ° ° °General Anesthesia, Adult, Care After °This sheet gives you information about how to care for yourself after your procedure. Your health care provider may also give you more specific instructions. If you have problems or questions, contact your health care provider. °What can I expect after the procedure? °After the procedure, the following side effects are common: °· Pain or discomfort at the IV site. °· Nausea. °· Vomiting. °· Sore throat. °· Trouble concentrating. °· Feeling cold or chills. °· Weak or tired. °· Sleepiness and fatigue. °· Soreness and body aches. These side effects can affect parts of the  body that were not involved in surgery. °Follow these instructions at home: ° °For at least 24 hours after the procedure: °· Have a responsible adult stay with you. It is important to have someone help care for you until you are awake and alert. °· Rest as needed. °· Do not: °? Participate in activities in which you could fall or become injured. °? Drive. °? Use heavy machinery. °? Drink alcohol. °? Take sleeping pills or medicines that cause drowsiness. °? Make important decisions or sign legal documents. °? Take care of children on your own. °Eating and drinking °· Follow any instructions from your health care provider about eating or drinking restrictions. °· When you feel hungry, start by eating small amounts of foods that are soft and easy to digest (bland), such as toast. Gradually return to your regular diet. °· Drink enough fluid to keep your urine pale yellow. °· If you vomit, rehydrate by drinking water, juice, or clear broth. °General instructions °· If   you have sleep apnea, surgery and certain medicines can increase your risk for breathing problems. Follow instructions from your health care provider about wearing your sleep device: °? Anytime you are sleeping, including during daytime naps. °? While taking prescription pain medicines, sleeping medicines, or medicines that make you drowsy. °· Return to your normal activities as told by your health care provider. Ask your health care provider what activities are safe for you. °· Take over-the-counter and prescription medicines only as told by your health care provider. °· If you smoke, do not smoke without supervision. °· Keep all follow-up visits as told by your health care provider. This is important. °Contact a health care provider if: °· You have nausea or vomiting that does not get better with medicine. °· You cannot eat or drink without vomiting. °· You have pain that does not get better with medicine. °· You are unable to pass urine. °· You develop  a skin rash. °· You have a fever. °· You have redness around your IV site that gets worse. °Get help right away if: °· You have difficulty breathing. °· You have chest pain. °· You have blood in your urine or stool, or you vomit blood. °Summary °· After the procedure, it is common to have a sore throat or nausea. It is also common to feel tired. °· Have a responsible adult stay with you for the first 24 hours after general anesthesia. It is important to have someone help care for you until you are awake and alert. °· When you feel hungry, start by eating small amounts of foods that are soft and easy to digest (bland), such as toast. Gradually return to your regular diet. °· Drink enough fluid to keep your urine pale yellow. °· Return to your normal activities as told by your health care provider. Ask your health care provider what activities are safe for you. °This information is not intended to replace advice given to you by your health care provider. Make sure you discuss any questions you have with your health care provider. °Document Released: 11/07/2000 Document Revised: 03/17/2017 Document Reviewed: 03/17/2017 °Elsevier Interactive Patient Education © 2019 [employer] ° °

## 2018-11-29 NOTE — Op Note (Signed)
Right 7 mm stone  Right ESWL   Findings: he tolerated procedure well with energy up to 5.2. Stone faded. He may need a staged procedure if fragments fail to pass.

## 2018-11-30 ENCOUNTER — Encounter (HOSPITAL_COMMUNITY): Payer: Self-pay | Admitting: Urology

## 2020-09-17 IMAGING — CT CT RENAL STONE PROTOCOL
2 of 4 series · 16 of 46 positions shown, 18 images · non-contrast
Comparison: October 03, 2013

CLINICAL DATA: Right flank pain for 1 week

EXAM:
CT ABDOMEN AND PELVIS WITHOUT CONTRAST
TECHNIQUE: Multidetector CT imaging of the abdomen and pelvis was performed
following the standard protocol without oral or IV contrast.

[Series 2: axial st · axial · 0.83mm/px · z∈[-486,-26]mm · 13 of 101 slices shown, 15 images]
[im 5/101  soft-tissue]
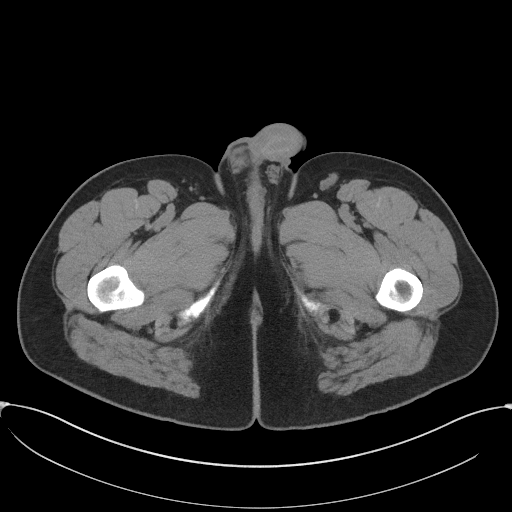
[im 5/101  bone]
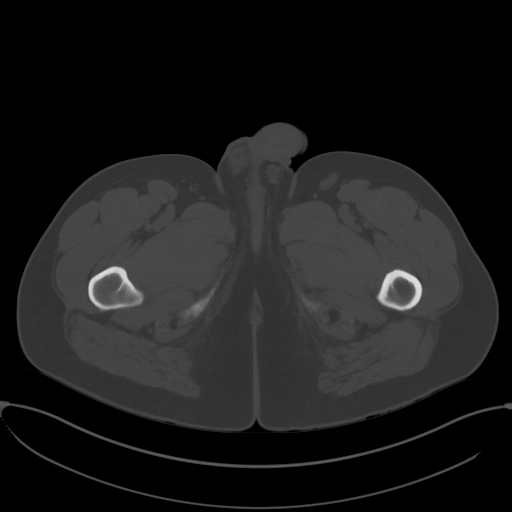
[im 13/101  soft-tissue]
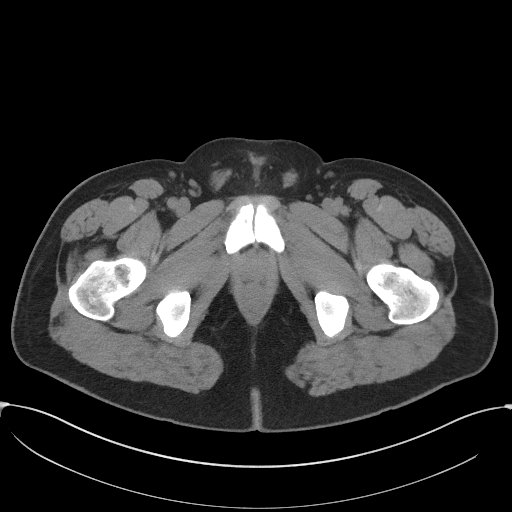
[im 21/101  soft-tissue]
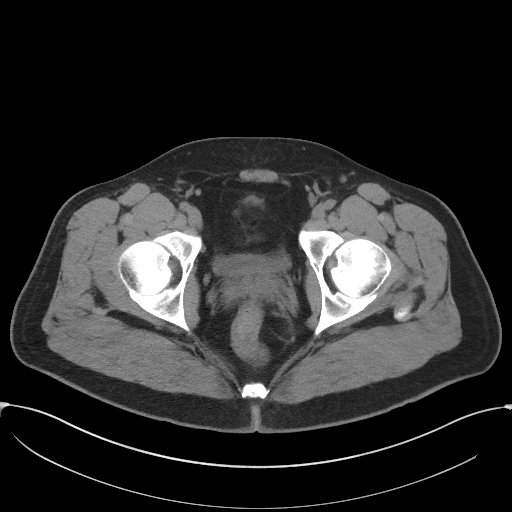
[im 29/101  soft-tissue]
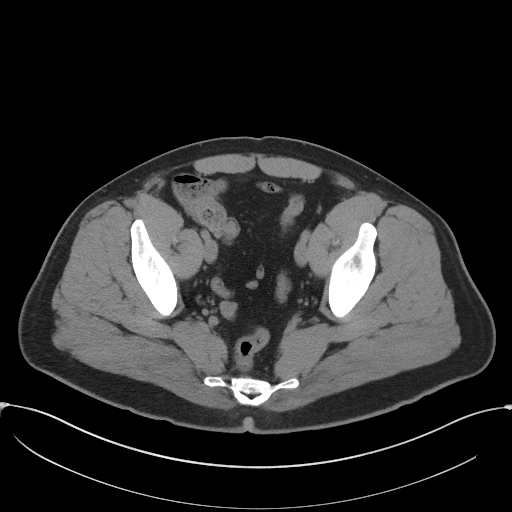
[im 37/101  soft-tissue]
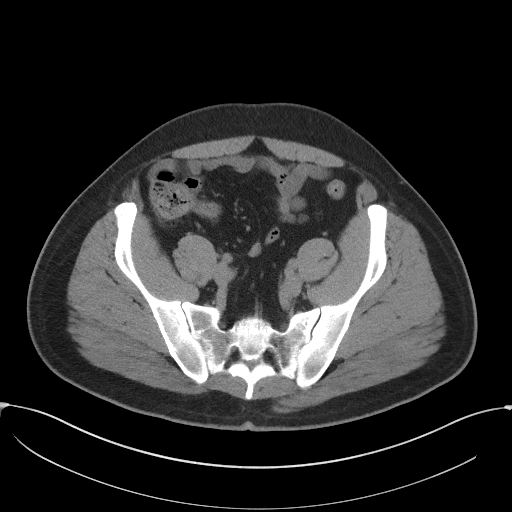
[im 45/101  soft-tissue]
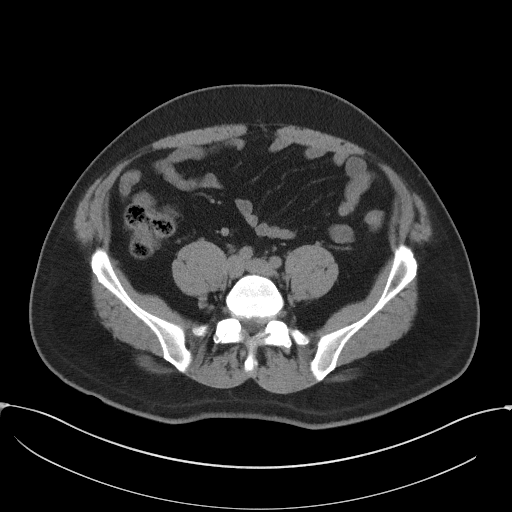
[im 53/101  soft-tissue]
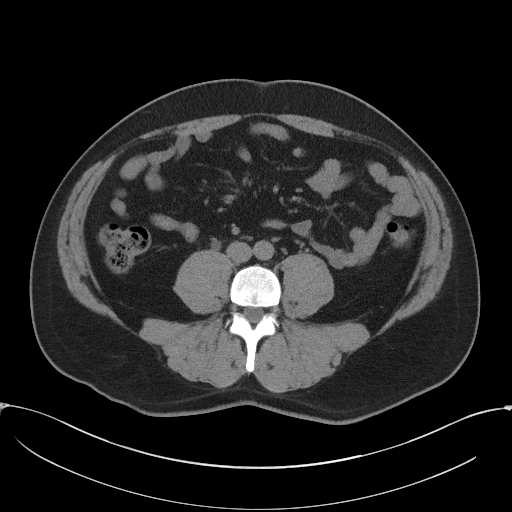
[im 57/101  soft-tissue]
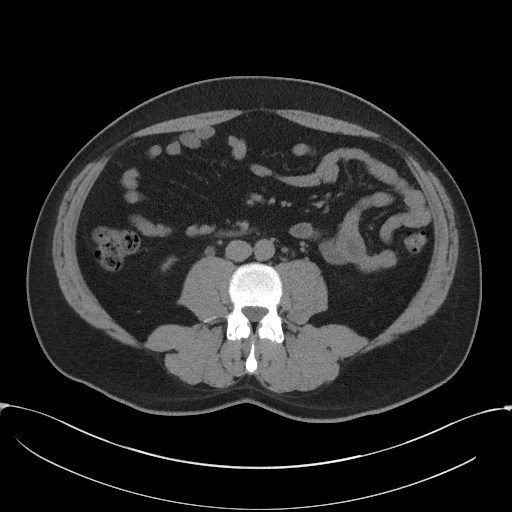
[im 65/101  soft-tissue]
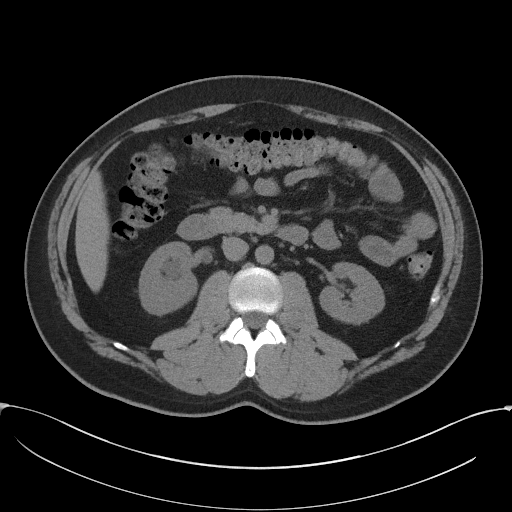
[im 65/101  bone]
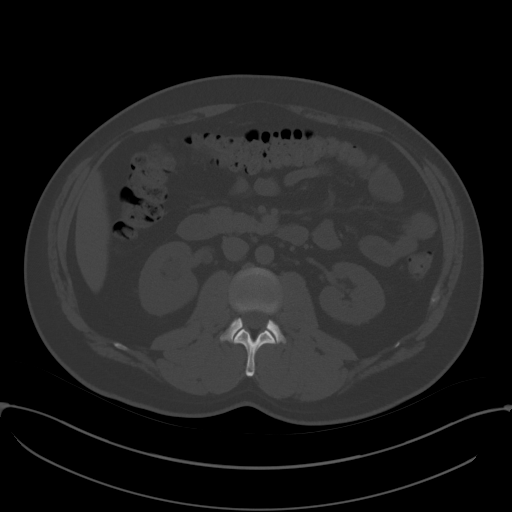
[im 73/101  soft-tissue]
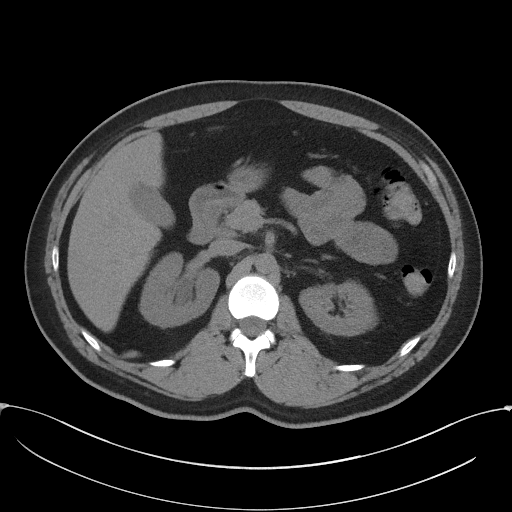
[im 81/101  soft-tissue]
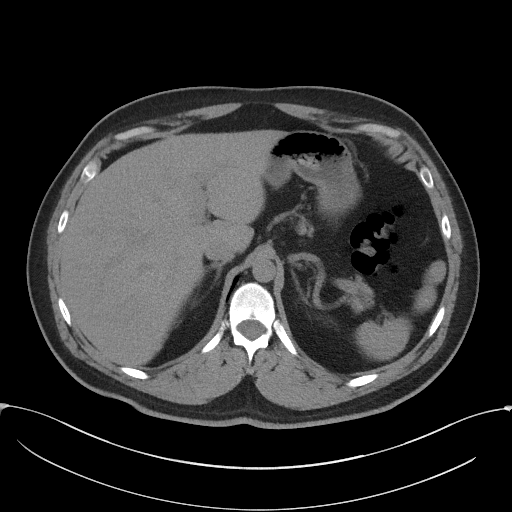
[im 89/101  soft-tissue]
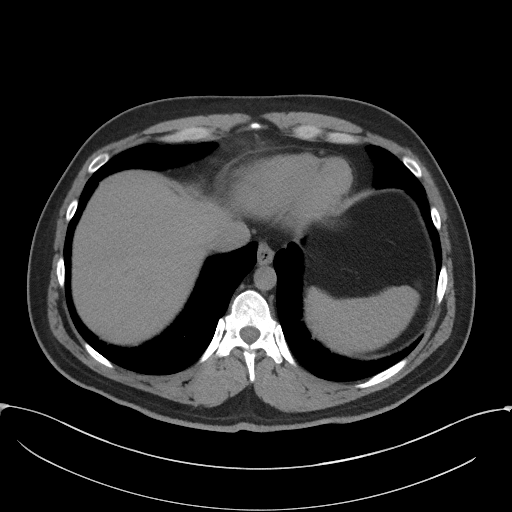
[im 97/101  soft-tissue]
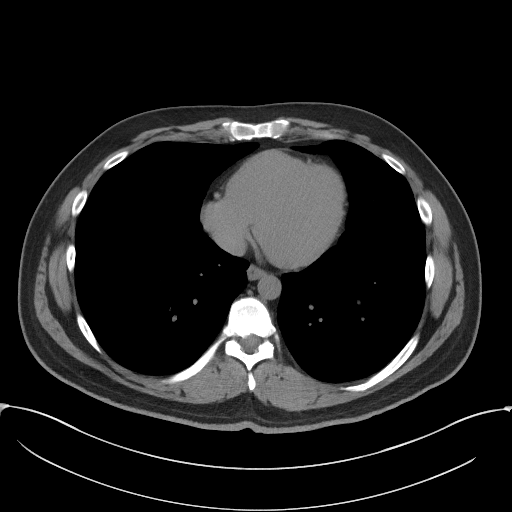

[Series 4: coronal st · coronal · 0.92mm/px · 3 of 99 slices shown]
[im 33/99  soft-tissue]
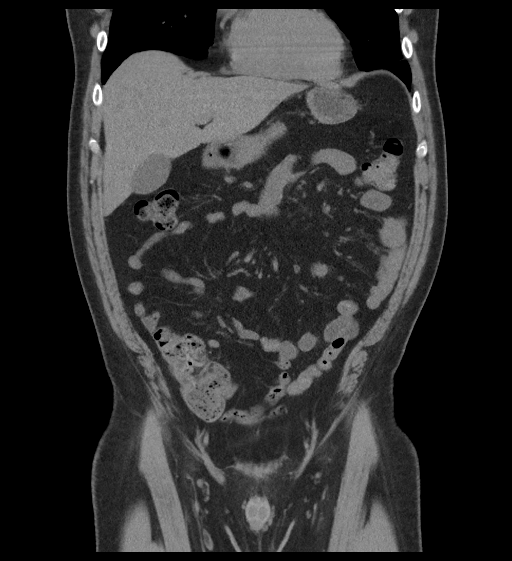
[im 44/99  soft-tissue]
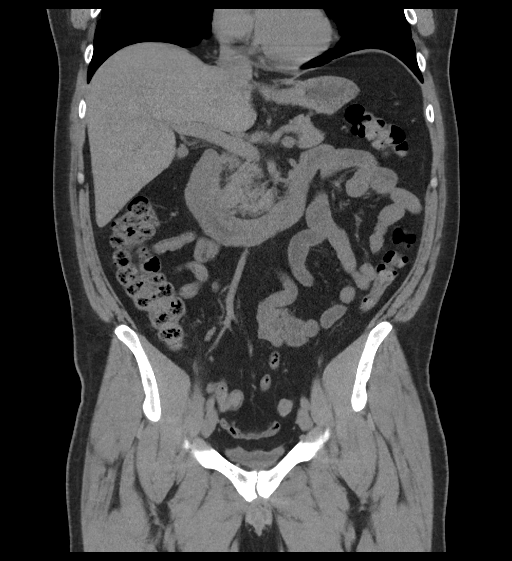
[im 55/99  soft-tissue]
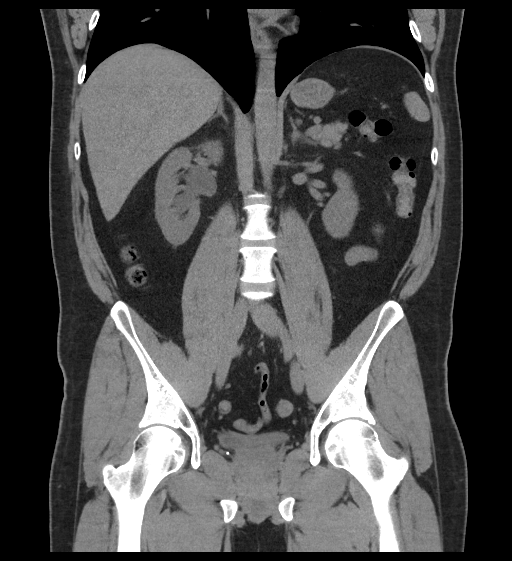

[16 of 46 positions shown; findings below may reference images not displayed]

FINDINGS: Lower chest: There is atelectatic change in the left base region.
There is no edema or consolidation.

Hepatobiliary: No focal liver lesions are apparent on this
noncontrast enhanced study. Gallbladder wall is not appreciably
thickened. There is no biliary duct dilatation.

Pancreas: There is no pancreatic mass or inflammatory focus.

Spleen: No splenic lesions are evident.

Adrenals/Urinary Tract: Adrenals bilaterally appear normal. There is
no evident renal mass on either side. There is moderate
hydronephrosis on the right. There is no appreciable hydronephrosis
on the left. There is a 2 mm nonobstructing calculus in the lower
pole left kidney. There is no intrarenal calculus on the right.
There is a calculus in the right ureter at the level of L4 measuring
7 x 6 mm. No other ureteral calculi are evident. Urinary bladder is
nearly completely empty. Urinary bladder wall thickness is felt to
be within normal limits.

Stomach/Bowel: There is no appreciable bowel wall or mesenteric
thickening. There is no evident bowel obstruction. There is no free
air or portal venous air.

Vascular/Lymphatic: There is no abdominal aortic aneurysm. No
vascular lesions are evident on this noncontrast enhanced study.
There is no adenopathy in the abdomen or pelvis.

Reproductive: Prostate and seminal vesicles are normal in size and
contour. There is no evident pelvic mass.

Other: Appendix appears normal. There is no abscess or ascites in
the abdomen or pelvis.

Musculoskeletal: There are no blastic or lytic bone lesions. There
is no intramuscular or abdominal wall lesion.
IMPRESSION: 1. There is a 7 x 6 mm calculus in the right ureter at the L4 level
causing moderate hydronephrosis on the right.

2.  Nonobstructing 2 mm calculus lower pole left kidney.

3. No bowel obstruction. No abscess in the abdomen or pelvis.
Appendix appears normal.

These results will be called to the ordering clinician or
representative by the Radiologist Assistant, and communication
documented in the PACS or zVision Dashboard.

## 2023-12-17 ENCOUNTER — Emergency Department (HOSPITAL_BASED_OUTPATIENT_CLINIC_OR_DEPARTMENT_OTHER)
Admission: EM | Admit: 2023-12-17 | Discharge: 2023-12-17 | Disposition: A | Discharge status: Home / Self Care | Attending: Emergency Medicine | Admitting: Emergency Medicine

## 2023-12-17 ENCOUNTER — Encounter (HOSPITAL_BASED_OUTPATIENT_CLINIC_OR_DEPARTMENT_OTHER): Payer: Self-pay

## 2023-12-17 ENCOUNTER — Other Ambulatory Visit: Payer: Self-pay

## 2023-12-17 DIAGNOSIS — T783XXA Angioneurotic edema, initial encounter: Secondary | ICD-10-CM | POA: Insufficient documentation

## 2023-12-17 MED ORDER — CETIRIZINE HCL 10 MG PO TABS: 10.0000 mg | ORAL_TABLET | Freq: Two times a day (BID) | ORAL | 0 refills | Status: DC

## 2023-12-17 MED ORDER — METHYLPREDNISOLONE SODIUM SUCC 125 MG IJ SOLR
125.0000 mg | Freq: Once | INTRAMUSCULAR | Status: AC
  Administered 2023-12-17: 125 mg via INTRAVENOUS
  Filled 2023-12-17: qty 2

## 2023-12-17 MED ORDER — EPINEPHRINE 0.3 MG/0.3ML IJ SOAJ: 0.3000 mg | Freq: Once | INTRAMUSCULAR | Status: DC

## 2023-12-17 MED ORDER — SODIUM CHLORIDE 0.9 % IV SOLN
INTRAVENOUS | Status: DC | PRN
  Administered 2023-12-17: 100 mL via INTRAVENOUS

## 2023-12-17 MED ORDER — FAMOTIDINE IN NACL 20-0.9 MG/50ML-% IV SOLN
20.0000 mg | Freq: Once | INTRAVENOUS | Status: AC
  Administered 2023-12-17: 20 mg via INTRAVENOUS
  Filled 2023-12-17: qty 50

## 2023-12-17 MED ORDER — PREDNISONE 10 MG PO TABS: 40.0000 mg | ORAL_TABLET | Freq: Every day | ORAL | 0 refills | Status: AC

## 2023-12-17 NOTE — ED Provider Notes (Signed)
 [facility] Provider Note   CSN: [id number] Arrival date & time: 12/17/23  0203     History  Chief Complaint  Patient presents with   Angioedema    Michael Werner is a 38 y.o. male.  HPI   37 year old male with medical history significant for Gaubert syndrome, kidney stones, angioedema who presents to the emergency department with upper lip swelling.  Symptoms started around 45 minutes prior to arrival.  He has had this happen twice before.  He has not on an ACE or an ARB.  He has never been tested for hereditary angioedema.  He states that he developed upper lip swelling and subsequently took 50 mg of oral Benadryl  at home.  Swelling has not progressed.  He denies any tongue swelling.  He denies any throat swelling, denies any feeling of closure of his throat or difficulty breathing, no stridor.  This morning he had hives on his inner thighs that resolved after application of cortisone cream.  He denies any shortness of breath, denies any nausea or vomiting.  He denies any known food allergies.  Home Medications Prior to Admission medications   Medication Sig Start Date End Date Taking? Authorizing Provider  cetirizine (ZYRTEC ALLERGY) 10 MG tablet Take 1 tablet (10 mg total) by mouth 2 (two) times daily for 7 days. 12/17/23 12/24/23 Yes Rosealee Concha, MD  predniSONE (DELTASONE) 10 MG tablet Take 4 tablets (40 mg total) by mouth daily for 5 days. 12/17/23 12/22/23 Yes Rosealee Concha, MD  docusate sodium (COLACE) 100 MG capsule Take 100 mg by mouth daily.     [provider]  oxyCODONE -acetaminophen  (PERCOCET/ROXICET) 5-325 MG tablet Take 2 tablets by mouth every 4 (four) hours as needed for severe pain. 11/09/18   Kandy Orris, MD  tamsulosin  (FLOMAX ) 0.4 MG CAPS capsule Take 1 capsule (0.4 mg total) by mouth daily. 11/05/18   Dain Drown, MD      Allergies    Codeine    Review of Systems   Review of Systems  All other systems  reviewed and are negative.   Physical Exam Updated Vital Signs BP (!) 141/107   Pulse 71   Temp 98 F (36.7 C) (Oral)   Resp 17   Ht 6' (1.829 m)   Wt 99.8 kg   SpO2 96%   BMI 29.84 kg/m  Physical Exam Vitals and nursing note reviewed.  Constitutional:      General: He is not in acute distress. HENT:     Head: Normocephalic and atraumatic.     Comments: Upper lip swelling consistent with angioedema, no oropharyngeal swelling, no tongue elevation, no stridor or trismus Eyes:     Conjunctiva/sclera: Conjunctivae normal.     Pupils: Pupils are equal, round, and reactive to light.  Cardiovascular:     Rate and Rhythm: Normal rate and regular rhythm.  Pulmonary:     Effort: Pulmonary effort is normal. No respiratory distress.  Abdominal:     General: There is no distension.     Tenderness: There is no guarding.  Musculoskeletal:        General: No deformity or signs of injury.     Cervical back: Neck supple.  Skin:    Findings: No lesion or rash.  Neurological:     General: No focal deficit present.     Mental Status: He is alert. Mental status is at baseline.     ED Results / Procedures / Treatments  Labs (all labs ordered are listed, but only abnormal results are displayed) Labs Reviewed - No data to display  EKG None  Radiology No results found.  Procedures Procedures    Medications Ordered in ED Medications  0.9 %  sodium chloride  infusion (0 mLs Intravenous Stopped 12/17/23 0328)  famotidine (PEPCID) IVPB 20 mg premix (0 mg Intravenous Stopped 12/17/23 0328)  methylPREDNISolone sodium succinate (SOLU-MEDROL) 125 mg/2 mL injection 125 mg (125 mg Intravenous Given 12/17/23 0242)    ED Course/ Medical Decision Making/ A&P                                 Medical Decision Making Risk OTC drugs. Prescription drug management.    38 year old male with medical history significant for Gaubert syndrome, kidney stones, angioedema who presents to the emergency  department with upper lip swelling.  Symptoms started around 45 minutes prior to arrival.  He has had this happen twice before.  He has not on an ACE or an ARB.  He has never been tested for hereditary angioedema.  He states that he developed upper lip swelling and subsequently took 50 mg of oral Benadryl  at home.  Swelling has not progressed.  He denies any tongue swelling.  He denies any throat swelling, denies any feeling of closure of his throat or difficulty breathing, no stridor.  This morning he had hives on his inner thighs that resolved after application of cortisone cream.  He denies any shortness of breath, denies any nausea or vomiting.  He denies any known food allergies.  On arrival, the patient was vitally stable.  Physical exam revealed upper lip swelling consistent with angioedema, no trismus, no stridor, lungs CTAB, no urticaria.  Low concern for anaphylaxis, low concern for airway involvement.  Patient denies any swelling in the oropharynx or tongue swelling.  Patient is maintaining his airway and protecting it.  IV access was obtained and the patient was administered IV Pepcid as well as IV Solu-Medrol.  No indication for epinephrine at this time.  Will observe the patient for 4 hours in the emergency department to ensure no progression of the patient's angioedema.  If stable or improved, will plan for discharge home.  The patient was observed for 4 hours with no progression of his symptoms, overall at this time feel that the patient is stable for discharge.  Return precautions provided, plan for outpatient follow-up.  Final Clinical Impression(s) / ED Diagnoses Final diagnoses:  Angioedema, initial encounter    Rx / DC Orders ED Discharge Orders          Ordered    cetirizine (ZYRTEC ALLERGY) 10 MG tablet  2 times daily        12/17/23 0435    predniSONE (DELTASONE) 10 MG tablet  Daily        12/17/23 0435              Rosealee Concha, MD 12/17/23 [phone]

## 2023-12-17 NOTE — ED Triage Notes (Signed)
 Pt POV from home d/t upper lip swelling approx 30-45 minutes ago.  Pt has had this before x2 and both lasted approx 12 hours.  Pt took 50 mg of Benadryl  at home but was advised to come to ER for eval next time it happened.  Pt stated he did have hives yesterday morning but went away and wondered if related.  Pt denies difficulty  breathing and denies taking Lisinopril.

## 2024-01-31 ENCOUNTER — Ambulatory Visit (INDEPENDENT_AMBULATORY_CARE_PROVIDER_SITE_OTHER): Admitting: Family Medicine

## 2024-01-31 ENCOUNTER — Encounter: Payer: Self-pay | Admitting: Family Medicine

## 2024-01-31 VITALS — BP 124/82 | HR 84 | Temp 98.5°F | Ht 73.0 in | Wt 221.4 lb

## 2024-01-31 DIAGNOSIS — Z1159 Encounter for screening for other viral diseases: Secondary | ICD-10-CM

## 2024-01-31 DIAGNOSIS — Z23 Encounter for immunization: Secondary | ICD-10-CM

## 2024-01-31 DIAGNOSIS — Z114 Encounter for screening for human immunodeficiency virus [HIV]: Secondary | ICD-10-CM

## 2024-01-31 DIAGNOSIS — Z0000 Encounter for general adult medical examination without abnormal findings: Secondary | ICD-10-CM

## 2024-01-31 DIAGNOSIS — K219 Gastro-esophageal reflux disease without esophagitis: Secondary | ICD-10-CM | POA: Diagnosis not present

## 2024-01-31 DIAGNOSIS — T783XXD Angioneurotic edema, subsequent encounter: Secondary | ICD-10-CM | POA: Diagnosis not present

## 2024-01-31 LAB — CBC WITH DIFFERENTIAL/PLATELET
Basophils Absolute: 0 10*3/uL (ref 0.0–0.1)
Basophils Relative: 0.7 % (ref 0.0–3.0)
Eosinophils Absolute: 0.2 10*3/uL (ref 0.0–0.7)
Eosinophils Relative: 3.5 % (ref 0.0–5.0)
HCT: 50.3 % (ref 39.0–52.0)
Hemoglobin: 16.8 g/dL (ref 13.0–17.0)
Lymphocytes Relative: 33.7 % (ref 12.0–46.0)
Lymphs Abs: 2.3 10*3/uL (ref 0.7–4.0)
MCHC: 33.4 g/dL (ref 30.0–36.0)
MCV: 90.6 fl (ref 78.0–100.0)
Monocytes Absolute: 0.7 10*3/uL (ref 0.1–1.0)
Monocytes Relative: 9.9 % (ref 3.0–12.0)
Neutro Abs: 3.5 10*3/uL (ref 1.4–7.7)
Neutrophils Relative %: 52.2 % (ref 43.0–77.0)
Platelets: 312 10*3/uL (ref 150.0–400.0)
RBC: 5.55 Mil/uL (ref 4.22–5.81)
RDW: 13.7 % (ref 11.5–15.5)
WBC: 6.7 10*3/uL (ref 4.0–10.5)

## 2024-01-31 LAB — LIPID PANEL
Cholesterol: 249 mg/dL — ABNORMAL HIGH (ref 0–200)
HDL: 42.2 mg/dL (ref >39.00)
LDL Cholesterol: 166 mg/dL — ABNORMAL HIGH (ref 0–99)
NonHDL: 206.61
Total CHOL/HDL Ratio: 6
Triglycerides: 204 mg/dL — ABNORMAL HIGH (ref 0.0–149.0)
VLDL: 40.8 mg/dL — ABNORMAL HIGH (ref 0.0–40.0)

## 2024-01-31 LAB — COMPREHENSIVE METABOLIC PANEL WITH GFR
ALT: 20 U/L (ref 0–53)
AST: 18 U/L (ref 0–37)
Albumin: 4.8 g/dL (ref 3.5–5.2)
Alkaline Phosphatase: 42 U/L (ref 39–117)
BUN: 10 mg/dL (ref 6–23)
CO2: 32 meq/L (ref 19–32)
Calcium: 9.8 mg/dL (ref 8.4–10.5)
Chloride: 103 meq/L (ref 96–112)
Creatinine, Ser: 1 mg/dL (ref 0.40–1.50)
GFR: 94.97 mL/min (ref >60.00)
Glucose, Bld: 68 mg/dL — ABNORMAL LOW (ref 70–99)
Potassium: 4.2 meq/L (ref 3.5–5.1)
Sodium: 140 meq/L (ref 135–145)
Total Bilirubin: 1.4 mg/dL — ABNORMAL HIGH (ref 0.2–1.2)
Total Protein: 7.7 g/dL (ref 6.0–8.3)

## 2024-01-31 LAB — TSH: TSH: 0.61 u[IU]/mL (ref 0.35–5.50)

## 2024-01-31 MED ORDER — EPINEPHRINE 0.3 MG/0.3ML IJ SOAJ: 0.3000 mg | INTRAMUSCULAR | 1 refills | Status: AC | PRN

## 2024-01-31 MED ORDER — PANTOPRAZOLE SODIUM 40 MG PO TBEC: 40.0000 mg | DELAYED_RELEASE_TABLET | Freq: Every day | ORAL | 1 refills | Status: DC

## 2024-01-31 NOTE — Addendum Note (Signed)
 Addended by: Trenda Frisk on: 01/31/2024 09:41 AM   Modules accepted: Orders
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
## 2024-02-01 ENCOUNTER — Ambulatory Visit: Payer: Self-pay | Admitting: Family Medicine

## 2024-02-01 LAB — HEPATITIS C ANTIBODY: Hepatitis C Ab: NONREACTIVE

## 2024-02-01 LAB — HIV ANTIBODY (ROUTINE TESTING W REFLEX): HIV 1&2 Ab, 4th Generation: NONREACTIVE
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
## 2024-02-22 ENCOUNTER — Ambulatory Visit: Payer: Self-pay | Admitting: Allergy

## 2024-02-22 ENCOUNTER — Encounter: Payer: Self-pay | Admitting: Allergy

## 2024-02-22 ENCOUNTER — Other Ambulatory Visit: Payer: Self-pay

## 2024-02-22 VITALS — BP 126/80 | HR 87 | Temp 98.9°F | Resp 18 | Ht 72.0 in | Wt 221.0 lb

## 2024-02-22 DIAGNOSIS — L509 Urticaria, unspecified: Secondary | ICD-10-CM

## 2024-02-22 DIAGNOSIS — T783XXD Angioneurotic edema, subsequent encounter: Secondary | ICD-10-CM

## 2024-02-22 DIAGNOSIS — T783XXA Angioneurotic edema, initial encounter: Secondary | ICD-10-CM

## 2024-02-22 NOTE — Patient Instructions (Addendum)
 Angioedema may be isolated or associated with urticaria. It may be histamine-induced or bradykinin-mediated (i.e. Hereditary Angioedema or ACE-I). Patients with histamine-induced angioedema are more likely to have associated symptoms of urticaria, pruritus or signs of anaphylaxis. Patients with bradykinin-mediated do not have any symptoms of pruritus or urticaria and they did not improve with Benadryl  or Epinephrine .  Try to avoid NSAIDs if possible as this class has been known to lower threshold for urticaria and angioedema. Do not take any ace-inhibitor type of mediations in the future.  Keep track of episodes and take pictures. Write down what you had done/eaten during flares.  Send me a Wellsite geologist.  For mild symptoms you can take over the counter antihistamines (zyrtec  10mg  to 20mg ) and monitor symptoms closely.  If symptoms worsen or if you have severe symptoms including breathing issues, throat closure, significant swelling, whole body hives, severe diarrhea and vomiting, lightheadedness then use epinephrine  and seek immediate medical care afterwards. Emergency action plan given.  Get bloodwork We are ordering labs, so please allow 1-2 weeks for the results to come back. With the newly implemented Cures Act, the labs might be visible to you at the same time that they become visible to me. However, I will not address the results until all of the results are back, so please be patient.  In the meantime, continue recommendations in your patient instructions, including avoidance measures (if applicable), until you hear from me.  Follow up in 6 months or sooner if needed.

## 2024-03-07 ENCOUNTER — Encounter: Payer: Self-pay | Admitting: Family Medicine

## 2024-03-07 ENCOUNTER — Ambulatory Visit: Admitting: Family Medicine

## 2024-03-07 VITALS — BP 118/81 | HR 85 | Temp 99.1°F | Ht 72.0 in | Wt 221.0 lb

## 2024-03-07 DIAGNOSIS — K219 Gastro-esophageal reflux disease without esophagitis: Secondary | ICD-10-CM | POA: Diagnosis not present

## 2024-03-07 MED ORDER — PANTOPRAZOLE SODIUM 20 MG PO TBEC: 20.0000 mg | DELAYED_RELEASE_TABLET | Freq: Every day | ORAL | 11 refills | Status: AC
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
## 2024-03-22 LAB — ALPHA-GAL PANEL
Allergen Lamb IgE: <0.1 kU/L
Beef IgE: <0.1 kU/L
IgE (Immunoglobulin E), Serum: 302 [IU]/mL (ref 6–495)
O215-IgE Alpha-Gal: <0.1 kU/L
Pork IgE: <0.1 kU/L

## 2024-03-22 LAB — C-REACTIVE PROTEIN: CRP: 2 mg/L (ref 0–10)

## 2024-03-22 LAB — C1 ESTERASE INHIBITOR: C1INH SerPl-mCnc: 33 mg/dL (ref 21–39)

## 2024-03-22 LAB — ANTINUCLEAR ANTIBODIES, IFA: ANA Titer 1: NEGATIVE

## 2024-03-22 LAB — TRYPTASE: Tryptase: 6.3 ug/L (ref 2.2–13.2)

## 2024-03-22 LAB — C1 ESTERASE INHIBITOR, FUNCTIONAL: C1INH Functional/C1INH Total MFr SerPl: >105 %{normal}

## 2024-03-22 LAB — C3 AND C4
Complement C3, Serum: 145 mg/dL (ref 82–167)
Complement C4, Serum: 32 mg/dL (ref 12–38)

## 2024-03-22 LAB — CHRONIC URTICARIA PD-BAT: Pooled Donor- BAT CU: 6.4 % (ref 0.00–10.60)

## 2024-03-22 LAB — SEDIMENTATION RATE: Sed Rate: 17 mm/h — ABNORMAL HIGH (ref 0–15)

## 2024-03-22 LAB — COMPLEMENT COMPONENT C1Q: Complement C1Q: 15.8 mg/dL (ref 10.2–20.3)

## 2024-03-26 ENCOUNTER — Ambulatory Visit: Payer: Self-pay | Admitting: Allergy

## 2024-08-26 NOTE — Progress Notes (Unsigned)
 "  Follow Up Note  RE: Michael Werner MRN: [id number] DOB: 13-Sep-1985 Date of Office Visit: 08/27/2024  Referring provider: Candise Aleene DEL, MD Primary care provider: Candise Aleene DEL, MD  Chief Complaint: follow up  History of Present Illness: I had the pleasure of seeing Michael Werner for a follow up visit at the [facility] on 08/27/2024. He is a 39 y.o. male, who is being followed for urticaria with angioedema. His previous allergy office visit was on 02/22/2024 with Dr. Luke. Today is a regular follow up visit.  Discussed the use of AI scribe software for clinical note transcription with the patient, who gave verbal consent to proceed.    He has not experienced any further episodes of hives or swelling since his last visit in July. He has not required any antihistamines like Zyrtec , Allegra, or Benadryl , and has not used his EpiPen .  He underwent a vasectomy in April of the previous year and has since experienced increased anger and a general sense of unhappiness. He is unsure if these emotional changes are related to the vasectomy, as he began around the same time as his episodes of hives and swelling.  No fevers, chills, changes in appetite, or breathing difficulties. Eating habits and bathroom routines remain normal.     2025 labs: Autoimmune screener, inflammation markers, chronic urticaria index (checks for autoantibodies that trigger mast cells), tryptase (checks for mast cell issues), angioedema panel (checks for certain swelling issues) and alpha gal (checks for red meat allergy) were all normal which is great.    Based on these results, no underlying issues found.    Keep track of episodes and take pictures. If swelling/hive episodes occur again let us  know.  Assessment and Plan: Michael Werner is a 39 y.o. male with: Angioedema Urticaria Past history - Recurrent lip swelling with sporadic episodes, some with hives. No specific triggers identified.  Possible link to infections/stress. No family history. Not on ace-inh/arb. Resolved with antihistamines within 1 day. Interim history - 2025 labs unremarkable. No additional episodes. Etiology unclear - possibly stress related.  Keep track of episodes and take pictures and let us  know.  Write down what you had done/eaten during flares.  For mild symptoms you can take over the counter antihistamines (zyrtec  10mg  to 20mg ) and monitor symptoms closely.  If symptoms worsen or if you have severe symptoms including breathing issues, throat closure, significant swelling, whole body hives, severe diarrhea and vomiting, lightheadedness then use epinephrine  and seek immediate medical care afterwards. Emergency action plan in place.   Return if symptoms worsen or fail to improve.  No orders of the defined types were placed in this encounter.  Lab Orders  No laboratory test(s) ordered today    Diagnostics: None.    Medication List:  Current Outpatient Medications  Medication Sig Dispense Refill   EPINEPHrine  (EPIPEN  2-PAK) 0.3 mg/0.3 mL IJ SOAJ injection Inject 0.3 mg into the muscle as needed for anaphylaxis. 1 each 1   pantoprazole  (PROTONIX ) 20 MG tablet Take 1 tablet (20 mg total) by mouth daily. 30 tablet 11   No current facility-administered medications for this visit.   Allergies: Allergies[1] I reviewed his past medical history, social history, family history, and environmental history and no significant changes have been reported from his previous visit.  Review of Systems  Constitutional:  Negative for appetite change, chills, fever and unexpected weight change.  HENT:  Negative for congestion and rhinorrhea.   Eyes:  Negative for itching.  Respiratory:  Negative for cough, chest tightness, shortness of breath and wheezing.   Cardiovascular:  Negative for chest pain.  Gastrointestinal:  Negative for abdominal pain.  Genitourinary:  Negative for difficulty urinating.  Skin:   Negative for rash.  Neurological:  Negative for headaches.    Objective: BP 108/80 (BP Location: Left Arm, Patient Position: Sitting, Cuff Size: Large)   Pulse 74   Temp 98.1 F (36.7 C) (Temporal)   Resp 18   Ht 6' (1.829 m)   Wt 226 lb 6.4 oz (102.7 kg)   SpO2 98%   BMI 30.71 kg/m  Body mass index is 30.71 kg/m. Physical Exam Vitals and nursing note reviewed.  Constitutional:      Appearance: Normal appearance. He is well-developed.  HENT:     Head: Normocephalic and atraumatic.     Right Ear: External ear normal.     Left Ear: External ear normal.     Ears:     Comments: Right TM scarring    Nose: Nose normal.     Mouth/Throat:     Mouth: Mucous membranes are moist.     Pharynx: Oropharynx is clear.  Eyes:     Conjunctiva/sclera: Conjunctivae normal.  Cardiovascular:     Rate and Rhythm: Normal rate and regular rhythm.     Heart sounds: Normal heart sounds. No murmur heard.    No friction rub. No gallop.  Pulmonary:     Effort: Pulmonary effort is normal.     Breath sounds: Normal breath sounds. No wheezing, rhonchi or rales.  Musculoskeletal:     Cervical back: Neck supple.  Skin:    General: Skin is warm.     Findings: No rash.  Neurological:     Mental Status: He is alert and oriented to person, place, and time.  Psychiatric:        Behavior: Behavior normal.    Previous notes and tests were reviewed. The plan was reviewed with the patient/family, and all questions/concerned were addressed.  It was my pleasure to see Michael Werner today and participate in his care. Please feel free to contact me with any questions or concerns.  Sincerely,  Orlan Cramp, DO Allergy & Immunology  [facility]  [facility] office: [phone] [facility] office: [phone]     [1]  Allergies Allergen Reactions   Codeine    "

## 2024-08-27 ENCOUNTER — Other Ambulatory Visit: Payer: Self-pay

## 2024-08-27 ENCOUNTER — Ambulatory Visit: Admitting: Allergy

## 2024-08-27 ENCOUNTER — Encounter: Payer: Self-pay | Admitting: Allergy

## 2024-08-27 VITALS — BP 108/80 | HR 74 | Temp 98.1°F | Resp 18 | Ht 72.0 in | Wt 226.4 lb

## 2024-08-27 DIAGNOSIS — T783XXD Angioneurotic edema, subsequent encounter: Secondary | ICD-10-CM | POA: Diagnosis not present

## 2024-08-27 DIAGNOSIS — L509 Urticaria, unspecified: Secondary | ICD-10-CM

## 2024-08-27 NOTE — Patient Instructions (Addendum)
 Keep track of episodes and take pictures. Write down what you had done/eaten during flares.  Send me a wellsite geologist.  For mild symptoms you can take over the counter antihistamines (zyrtec  10mg  to 20mg ) and monitor symptoms closely.  If symptoms worsen or if you have severe symptoms including breathing issues, throat closure, significant swelling, whole body hives, severe diarrhea and vomiting, lightheadedness then use epinephrine  and seek immediate medical care afterwards. Emergency action plan in place.   Follow up as needed.

## 2025-03-11 ENCOUNTER — Encounter: Admitting: Family Medicine
